# Patient Record
Sex: Female | Born: 1985
Health system: Southern US, Community
[De-identification: ages and names within clinical notes are randomized; demographics above are authoritative.]

## PROBLEM LIST (undated history)

## (undated) DIAGNOSIS — J40 Bronchitis, not specified as acute or chronic: Secondary | ICD-10-CM

## (undated) DIAGNOSIS — J45909 Unspecified asthma, uncomplicated: Secondary | ICD-10-CM

## (undated) HISTORY — PX: CHOLECYSTECTOMY: SHX55

## (undated) HISTORY — PX: DILATION AND CURETTAGE OF UTERUS: SHX78

## (undated) HISTORY — PX: CYST EXCISION: SHX5701

---
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
## 2010-08-25 DIAGNOSIS — J029 Acute pharyngitis, unspecified: Secondary | ICD-10-CM | POA: Insufficient documentation

## 2010-08-26 ENCOUNTER — Emergency Department (HOSPITAL_BASED_OUTPATIENT_CLINIC_OR_DEPARTMENT_OTHER)
Admission: EM | Admit: 2010-08-26 | Discharge: 2010-08-26 | Disposition: A | Discharge status: Home / Self Care | Payer: Medicaid Other | Attending: Emergency Medicine | Admitting: Emergency Medicine

## 2010-08-26 ENCOUNTER — Encounter: Payer: Self-pay | Admitting: *Deleted

## 2010-08-26 LAB — RAPID STREP SCREEN (MED CTR MEBANE ONLY): Streptococcus, Group A Screen (Direct): NEGATIVE

## 2010-08-26 NOTE — ED Notes (Signed)
C/o sore throat, headache, and bilateral ear pain since Thursday

## 2011-10-25 ENCOUNTER — Emergency Department (HOSPITAL_BASED_OUTPATIENT_CLINIC_OR_DEPARTMENT_OTHER)
Admission: EM | Admit: 2011-10-25 | Discharge: 2011-10-26 | Disposition: A | Discharge status: Home / Self Care | Payer: Medicaid Other | Attending: Emergency Medicine | Admitting: Emergency Medicine

## 2011-10-25 ENCOUNTER — Encounter (HOSPITAL_BASED_OUTPATIENT_CLINIC_OR_DEPARTMENT_OTHER): Payer: Self-pay | Admitting: *Deleted

## 2011-10-25 DIAGNOSIS — O98819 Other maternal infectious and parasitic diseases complicating pregnancy, unspecified trimester: Secondary | ICD-10-CM | POA: Insufficient documentation

## 2011-10-25 DIAGNOSIS — J069 Acute upper respiratory infection, unspecified: Secondary | ICD-10-CM

## 2011-10-25 LAB — RAPID STREP SCREEN (MED CTR MEBANE ONLY): Streptococcus, Group A Screen (Direct): NEGATIVE

## 2011-10-25 NOTE — ED Notes (Addendum)
Pt c/o sore throat and bil ear pain, pt seen at [place] [facility] x 1 day ago , given macrobid for uti and flonase w/o relief, pt is 17 weeks preg

## 2011-10-26 NOTE — ED Notes (Signed)
D/c to home- no new rx given 

## 2011-10-26 NOTE — ED Provider Notes (Signed)
History     CSN: [id number]  Arrival date & time 10/25/11  2319   First MD Initiated Contact with Patient 10/26/11 0007      Chief Complaint  Patient presents with  . Sore Throat    (Consider location/radiation/quality/duration/timing/severity/associated sxs/prior treatment) Patient is a 26 y.o. female presenting with pharyngitis. The history is provided by the patient.  Sore Throat This is a new problem. The current episode started 2 days ago. The problem occurs constantly. The problem has not changed since onset.Pertinent negatives include no chest pain, no abdominal pain, no headaches and no shortness of breath. Nothing aggravates the symptoms. Nothing relieves the symptoms. She has tried nothing for the symptoms. The treatment provided no relief.  Seen at [facility] and had full labs and xrays and nothing was found.  [redacted] weeks pregnant  History reviewed. No pertinent past medical history.  History reviewed. No pertinent past surgical history.  History reviewed. No pertinent family history.  History  Substance Use Topics  . Smoking status: Never Smoker   . Smokeless tobacco: Not on file  . Alcohol Use: No    OB History    Grav Para Term Preterm Abortions TAB SAB Ect Mult Living                  Review of Systems  HENT: Positive for congestion, sore throat, rhinorrhea and sneezing. Negative for trouble swallowing and voice change.   Respiratory: Negative for shortness of breath.   Cardiovascular: Negative for chest pain.  Gastrointestinal: Negative for abdominal pain.  Neurological: Negative for headaches.  All other systems reviewed and are negative.    Allergies  Review of patient's allergies indicates no known allergies.  Home Medications  No current outpatient prescriptions on file.  BP 129/77  Pulse 107  Temp 98.4 F (36.9 C) (Oral)  Resp 16  Ht 5' (1.524 m)  Wt 254 lb (115.214 kg)  BMI 49.61 kg/m2  SpO2 100%  LMP 06/30/2011  Physical Exam    Constitutional: She is oriented to person, place, and time. She appears well-developed and well-nourished.  HENT:  Head: Normocephalic and atraumatic.  Right Ear: External ear normal.  Left Ear: External ear normal.  Nose: Nose normal.  Mouth/Throat: Oropharynx is clear and moist.  Eyes: Conjunctivae normal are normal. Pupils are equal, round, and reactive to light.  Neck: Normal range of motion. Neck supple.  Cardiovascular: Normal rate and regular rhythm.   Pulmonary/Chest: Effort normal and breath sounds normal. No stridor. She has no wheezes. She has no rales.  Abdominal: Soft. Bowel sounds are normal. There is no tenderness. There is no rebound and no guarding.  Musculoskeletal: Normal range of motion.  Lymphadenopathy:    She has no cervical adenopathy.  Neurological: She is alert and oriented to person, place, and time.  Skin: Skin is warm and dry.  Psychiatric: She has a normal mood and affect.    ED Course  Procedures (including critical care time)   Labs Reviewed  RAPID STREP SCREEN   No results found.   1. URI (upper respiratory infection)       MDM  Cannot take ibuprofen nor alleve nor decongestants as is [redacted] weeks pregnant.  Call your OB for ongoing care        Ziona Wickens K Jomari Bartnik-Rasch, MD 10/26/11 0020

## 2015-03-29 ENCOUNTER — Emergency Department (HOSPITAL_BASED_OUTPATIENT_CLINIC_OR_DEPARTMENT_OTHER): Payer: Medicaid Other

## 2015-03-29 ENCOUNTER — Encounter (HOSPITAL_BASED_OUTPATIENT_CLINIC_OR_DEPARTMENT_OTHER): Payer: Self-pay | Admitting: *Deleted

## 2015-03-29 ENCOUNTER — Emergency Department (HOSPITAL_BASED_OUTPATIENT_CLINIC_OR_DEPARTMENT_OTHER)
Admission: EM | Admit: 2015-03-29 | Discharge: 2015-03-29 | Disposition: A | Discharge status: Home / Self Care | Payer: Medicaid Other | Attending: Emergency Medicine | Admitting: Emergency Medicine

## 2015-03-29 DIAGNOSIS — J069 Acute upper respiratory infection, unspecified: Secondary | ICD-10-CM

## 2015-03-29 DIAGNOSIS — J45901 Unspecified asthma with (acute) exacerbation: Secondary | ICD-10-CM | POA: Insufficient documentation

## 2015-03-29 DIAGNOSIS — R05 Cough: Secondary | ICD-10-CM | POA: Diagnosis present

## 2015-03-29 DIAGNOSIS — B9789 Other viral agents as the cause of diseases classified elsewhere: Secondary | ICD-10-CM

## 2015-03-29 HISTORY — DX: Bronchitis, not specified as acute or chronic: J40

## 2015-03-29 MED ORDER — IPRATROPIUM BROMIDE 0.02 % IN SOLN
0.5000 mg | Freq: Once | RESPIRATORY_TRACT | Status: AC
  Administered 2015-03-29: 0.5 mg via RESPIRATORY_TRACT
  Filled 2015-03-29: qty 2.5

## 2015-03-29 MED ORDER — ALBUTEROL SULFATE (2.5 MG/3ML) 0.083% IN NEBU
2.5000 mg | INHALATION_SOLUTION | Freq: Once | RESPIRATORY_TRACT | Status: AC
  Administered 2015-03-29: 2.5 mg via RESPIRATORY_TRACT
  Filled 2015-03-29: qty 3

## 2015-03-29 MED ORDER — ALBUTEROL SULFATE HFA 108 (90 BASE) MCG/ACT IN AERS: 1.0000 | INHALATION_SPRAY | Freq: Four times a day (QID) | RESPIRATORY_TRACT | Status: DC | PRN

## 2015-03-29 MED ORDER — ALBUTEROL SULFATE (2.5 MG/3ML) 0.083% IN NEBU
5.0000 mg | INHALATION_SOLUTION | Freq: Once | RESPIRATORY_TRACT | Status: AC
  Administered 2015-03-29: 5 mg via RESPIRATORY_TRACT
  Filled 2015-03-29: qty 6

## 2015-03-29 MED ORDER — IPRATROPIUM-ALBUTEROL 0.5-2.5 (3) MG/3ML IN SOLN
3.0000 mL | Freq: Once | RESPIRATORY_TRACT | Status: AC
  Administered 2015-03-29: 3 mL via RESPIRATORY_TRACT
  Filled 2015-03-29: qty 3

## 2015-03-29 MED ORDER — PREDNISONE 50 MG PO TABS
60.0000 mg | ORAL_TABLET | Freq: Once | ORAL | Status: AC
  Administered 2015-03-29: 60 mg via ORAL
  Filled 2015-03-29: qty 1

## 2015-03-29 MED ORDER — PREDNISONE 20 MG PO TABS
60.0000 mg | ORAL_TABLET | Freq: Every day | ORAL | Status: DC
Start: 2015-03-29 — End: 2015-06-15

## 2015-03-29 MED FILL — predniSONE 20 MG TABS: 20 | 4 days supply | Qty: 12 | Fill #0

## 2015-03-29 MED FILL — PROAIR HFA 90 MCG INHALER: 108 (90 BAS | 30 days supply | Qty: 9 | Fill #0

## 2015-03-29 NOTE — ED Notes (Signed)
C/o productive cough,  Dark green, wheezing, sneezing and chills onset Saturday  Denies fever,  Has used an inhaler

## 2015-03-29 NOTE — ED Provider Notes (Signed)
CSN: [id number]     Arrival date & time 03/29/15  1023 History   First MD Initiated Contact with Patient 03/29/15 1214     Chief Complaint  Patient presents with  . Cough     (Consider location/radiation/quality/duration/timing/severity/associated sxs/prior Treatment) HPI Comments: Patient with a history of Asthma presents today with a chief complaint of cough x 2 days.    Patient is a 30 y.o. female presenting with cough. The history is provided by the patient.  Cough Cough characteristics:  Non-productive Onset quality:  Gradual Duration:  2 days Timing:  Intermittent Progression:  Worsening Chronicity:  New Smoker: no   Context: not sick contacts   Relieved by:  Nothing Ineffective treatments: Theraflu, Alka Seltzer, Albuterol inhaler. Associated symptoms: rhinorrhea, shortness of breath and wheezing   Associated symptoms: no chest pain, no chills, no fever, no headaches, no sinus congestion and no sore throat     Past Medical History  Diagnosis Date  . Bronchitis    Past Surgical History  Procedure Laterality Date  . Dilation and curettage of uterus    . Cholecystectomy     No family history on file. Social History  Substance Use Topics  . Smoking status: Never Smoker   . Smokeless tobacco: Never Used  . Alcohol Use: No   OB History    No data available     Review of Systems  Constitutional: Negative for fever and chills.  HENT: Positive for rhinorrhea. Negative for sore throat.   Respiratory: Positive for cough, shortness of breath and wheezing.   Cardiovascular: Negative for chest pain.  Neurological: Negative for headaches.  All other systems reviewed and are negative.     Allergies  Review of patient's allergies indicates no known allergies.  Home Medications   Prior to Admission medications   Not on File   BP 122/74 mmHg  Pulse 103  Temp(Src) 98.3 F (36.8 C) (Oral)  Resp 18  Ht 5' (1.524 m)  Wt 109.77 kg  BMI 47.26 kg/m2  SpO2 100%   LMP 03/14/2015 Physical Exam  Constitutional: She appears well-developed and well-nourished.  HENT:  Head: Normocephalic and atraumatic.  Mouth/Throat: Oropharynx is clear and moist.  Neck: Normal range of motion. Neck supple.  Cardiovascular: Normal rate, regular rhythm and normal heart sounds.   Pulmonary/Chest: Effort normal. She has wheezes.  Mild diffuse expiratory wheezing  Musculoskeletal: Normal range of motion.  Neurological: She is alert.  Skin: Skin is warm and dry.  Psychiatric: She has a normal mood and affect.  Nursing note and vitals reviewed.   ED Course  Procedures (including critical care time) Labs Review Labs Reviewed - No data to display  Imaging Review Dg Chest 2 View  03/29/2015  CLINICAL DATA:  30 year old female with cough, wheezing and shortness breath for the past 2 days. Initial encounter. EXAM: CHEST  2 VIEW COMPARISON:  06/06/2014. FINDINGS: No infiltrate, congestive heart failure or pneumothorax. Minimal chronic peribronchial thickening. Heart size within normal limits. No plain film evidence of adenopathy. Mild anterior wedging mid thoracic vertebra with superimposed degenerative changes may have progressed slightly since prior exam. IMPRESSION: No infiltrate, congestive heart failure or pneumothorax. Minimal chronic peribronchial thickening. Mild anterior wedging mid thoracic vertebra with superimposed degenerative changes may have progressed slightly since prior exam. Electronically Signed   By: Lacy DuverneySteven  Olson M.D.   On: 03/29/2015 12:05   I have personally reviewed and evaluated these images and lab results as part of my medical decision-making.   EKG  Interpretation None     1:51 PM Reassessed patient.  She reports that breathing has improved.  Lungs CTAB.  Patient denies any SOB at this time. MDM   Final diagnoses:  None   Pt CXR negative for acute infiltrate. Patients symptoms are consistent with URI, likely viral etiology. Discussed that  antibiotics are not indicated for viral infections. Patient also with Asthma exacerbation.  Pulse ox 100 on RA.  Breath sounds improved with breathing treatment and Prednisone.  Pt will be discharged with symptomatic treatment.  Verbalizes understanding and is agreeable with plan. Pt is hemodynamically stable & in NAD prior to dc.  Stable for discharge.  Return precautions given.     Santiago Glad, PA-C 03/29/15 1624  Geoffery Lyons, MD 03/30/15 [phone]

## 2015-03-29 NOTE — ED Notes (Signed)
Cough, wheezing, SOB since Saturday morning. Assessed in triage by RT and given breathing tx with some relief

## 2015-06-15 ENCOUNTER — Encounter (HOSPITAL_BASED_OUTPATIENT_CLINIC_OR_DEPARTMENT_OTHER): Payer: Self-pay

## 2015-06-15 ENCOUNTER — Emergency Department (HOSPITAL_BASED_OUTPATIENT_CLINIC_OR_DEPARTMENT_OTHER)
Admission: EM | Admit: 2015-06-15 | Discharge: 2015-06-15 | Disposition: A | Discharge status: Home / Self Care | Payer: Medicaid Other | Attending: Emergency Medicine | Admitting: Emergency Medicine

## 2015-06-15 DIAGNOSIS — L0231 Cutaneous abscess of buttock: Secondary | ICD-10-CM | POA: Diagnosis not present

## 2015-06-15 MED ORDER — LIDOCAINE-EPINEPHRINE 1 %-1:100000 IJ SOLN
10.0000 mL | Freq: Once | INTRAMUSCULAR | Status: AC
  Administered 2015-06-15: 10 mL
  Filled 2015-06-15: qty 1

## 2015-06-15 MED ORDER — SULFAMETHOXAZOLE-TRIMETHOPRIM 800-160 MG PO TABS: 1.0000 | ORAL_TABLET | Freq: Two times a day (BID) | ORAL | Status: AC

## 2015-06-15 MED ORDER — SULFAMETHOXAZOLE-TRIMETHOPRIM 800-160 MG PO TABS
1.0000 | ORAL_TABLET | Freq: Once | ORAL | Status: AC
  Administered 2015-06-15: 1 via ORAL
  Filled 2015-06-15: qty 1

## 2015-06-15 MED ORDER — OXYCODONE-ACETAMINOPHEN 5-325 MG PO TABS
1.0000 | ORAL_TABLET | Freq: Once | ORAL | Status: AC
  Administered 2015-06-15: 1 via ORAL
  Filled 2015-06-15: qty 1

## 2015-06-15 MED ORDER — IBUPROFEN 400 MG PO TABS
400.0000 mg | ORAL_TABLET | Freq: Once | ORAL | Status: AC
  Administered 2015-06-15: 400 mg via ORAL
  Filled 2015-06-15: qty 1

## 2015-06-15 MED FILL — SULFAMETHOXAZOLE-TMP DS TAB: 800-160 | 7 days supply | Qty: 14 | Fill #0

## 2015-06-15 NOTE — ED Notes (Signed)
C/o "boil" to buttock since friday

## 2015-06-15 NOTE — Discharge Instructions (Signed)
Incision and Drainage Incision and drainage is a procedure in which a sac-like structure (cystic structure) is opened and drained. The area to be drained usually contains material such as pus, fluid, or blood.  LET YOUR CAREGIVER KNOW ABOUT:   Allergies to medicine.  Medicines taken, including vitamins, herbs, eyedrops, over-the-counter medicines, and creams.  Use of steroids (by mouth or creams).  Previous problems with anesthetics or numbing medicines.  History of bleeding problems or blood clots.  Previous surgery.  Other health problems, including diabetes and kidney problems.  Possibility of pregnancy, if this applies. RISKS AND COMPLICATIONS  Pain.  Bleeding.  Scarring.  Infection. BEFORE THE PROCEDURE  You may need to have an ultrasound or other imaging tests to see how large or deep your cystic structure is. Blood tests may also be used to determine if you have an infection or how severe the infection is. You may need to have a tetanus shot. PROCEDURE  The affected area is cleaned with a cleaning fluid. The cyst area will then be numbed with a medicine (local anesthetic). A small incision will be made in the cystic structure. A syringe or catheter may be used to drain the contents of the cystic structure, or the contents may be squeezed out. The area will then be flushed with a cleansing solution. After cleansing the area, it is often gently packed with a gauze or another wound dressing. Once it is packed, it will be covered with gauze and tape or some other type of wound dressing. AFTER THE PROCEDURE   Often, you will be allowed to go home right after the procedure.  You may be given antibiotic medicine to prevent or heal an infection.  If the area was packed with gauze or some other wound dressing, you will likely need to come back in 1 to 2 days to get it removed.  The area should heal in about 14 days.   This information is not intended to replace advice given  to you by your health care provider. Make sure you discuss any questions you have with your health care provider.   Document Released: 06/28/2000 Document Revised: 07/04/2011 Document Reviewed: 02/27/2011 Elsevier Interactive Patient Education 2016 [employer]  

## 2015-06-15 NOTE — ED Provider Notes (Signed)
CSN: [id number]     Arrival date & time 06/15/15  1147 History   First MD Initiated Contact with Patient 06/15/15 1257     Chief Complaint  Patient presents with  . Abscess     (Consider location/radiation/quality/duration/timing/severity/associated sxs/prior Treatment) HPI   30 year old female with abscess. Patient reports onset of a "boil" on Friday. Right upper buttock. Increasingly becoming more painful and larger in size. Small amount of purulent drainage. No fevers or chills. No nausea or vomiting. No history of diabetes or other immunocompromising factors that she is aware of.   Past Medical History  Diagnosis Date  . Bronchitis    Past Surgical History  Procedure Laterality Date  . Dilation and curettage of uterus    . Cholecystectomy     No family history on file. Social History  Substance Use Topics  . Smoking status: Never Smoker   . Smokeless tobacco: Never Used  . Alcohol Use: No   OB History    No data available     Review of Systems  All systems reviewed and negative, other than as noted in HPI.   Allergies  Review of patient's allergies indicates no known allergies.  Home Medications   Prior to Admission medications   Not on File   BP 127/90 mmHg  Pulse 100  Temp(Src) 98.6 F (37 C) (Oral)  Resp 20  Ht 5' (1.524 m)  Wt 243 lb (110.224 kg)  BMI 47.46 kg/m2  SpO2 100%  LMP 06/04/2015 Physical Exam  Constitutional: She appears well-developed and well-nourished. No distress.  HENT:  Head: Normocephalic and atraumatic.  Eyes: Conjunctivae are normal. Right eye exhibits no discharge. Left eye exhibits no discharge.  Neck: Neck supple.  Cardiovascular: Normal rate, regular rhythm and normal heart sounds.  Exam reveals no gallop and no friction rub.   No murmur heard. Pulmonary/Chest: Effort normal and breath sounds normal. No respiratory distress.  Abdominal: Soft. She exhibits no distension. There is no tenderness.  Musculoskeletal: She  exhibits no edema or tenderness.       Legs: Large, tender fluctuant mass in the pictured area. Approximately 3-4 cm of surrounding cellulitis. This does not extend anywhere close to anus.   Neurological: She is alert.  Skin: Skin is warm and dry.  Psychiatric: She has a normal mood and affect. Her behavior is normal. Thought content normal.  Nursing note and vitals reviewed.   ED Course  Procedures (including critical care time)  INCISION AND DRAINAGE Performed by: Raeford Razor Consent: Verbal consent obtained. Risks and benefits: risks, benefits and alternatives were discussed Type: abscess  Body area: R buttock  Anesthesia: local infiltration  Incision was made with a scalpel.  Local anesthetic: lidocaine 1% w/ epinephrine  Anesthetic total: 5 ml  Complexity: complex Blunt dissection to break up loculations  Drainage: purulent  Drainage amount: copious/foul smelling  Packing material: 1/4 in iodoform gauze  Patient tolerance: Patient tolerated the procedure well with no immediate complications.    Labs Review Labs Reviewed - No data to display  Imaging Review No results found. I have personally reviewed and evaluated these images and lab results as part of my medical decision-making.   EKG Interpretation None      MDM   Final diagnoses:  Abscess of buttock, right    29yF with alrge abscess to buttock. Initially I&D'd area to R of midline over large fluctuant mass. When draining it, I also noticed sputtering from a pin hole sized spot at midline. Second  incision made in this area with additional amount of copious drainage. There is a small area of cellulitis. Will place on abx. Return precautions discussed. Continued wound care otherwise.     Raeford RazorStephen Ryann Pauli, MD 06/16/15 [phone]

## 2018-02-22 IMAGING — CR DG CHEST 2V
2 series · 2 of 2 positions shown · non-contrast
Comparison: 06/06/2014.

CLINICAL DATA: 29-year-old female with cough, wheezing and
shortness breath for the past 2 days. Initial encounter.

EXAM:
CHEST  2 VIEW

[w chest pa]
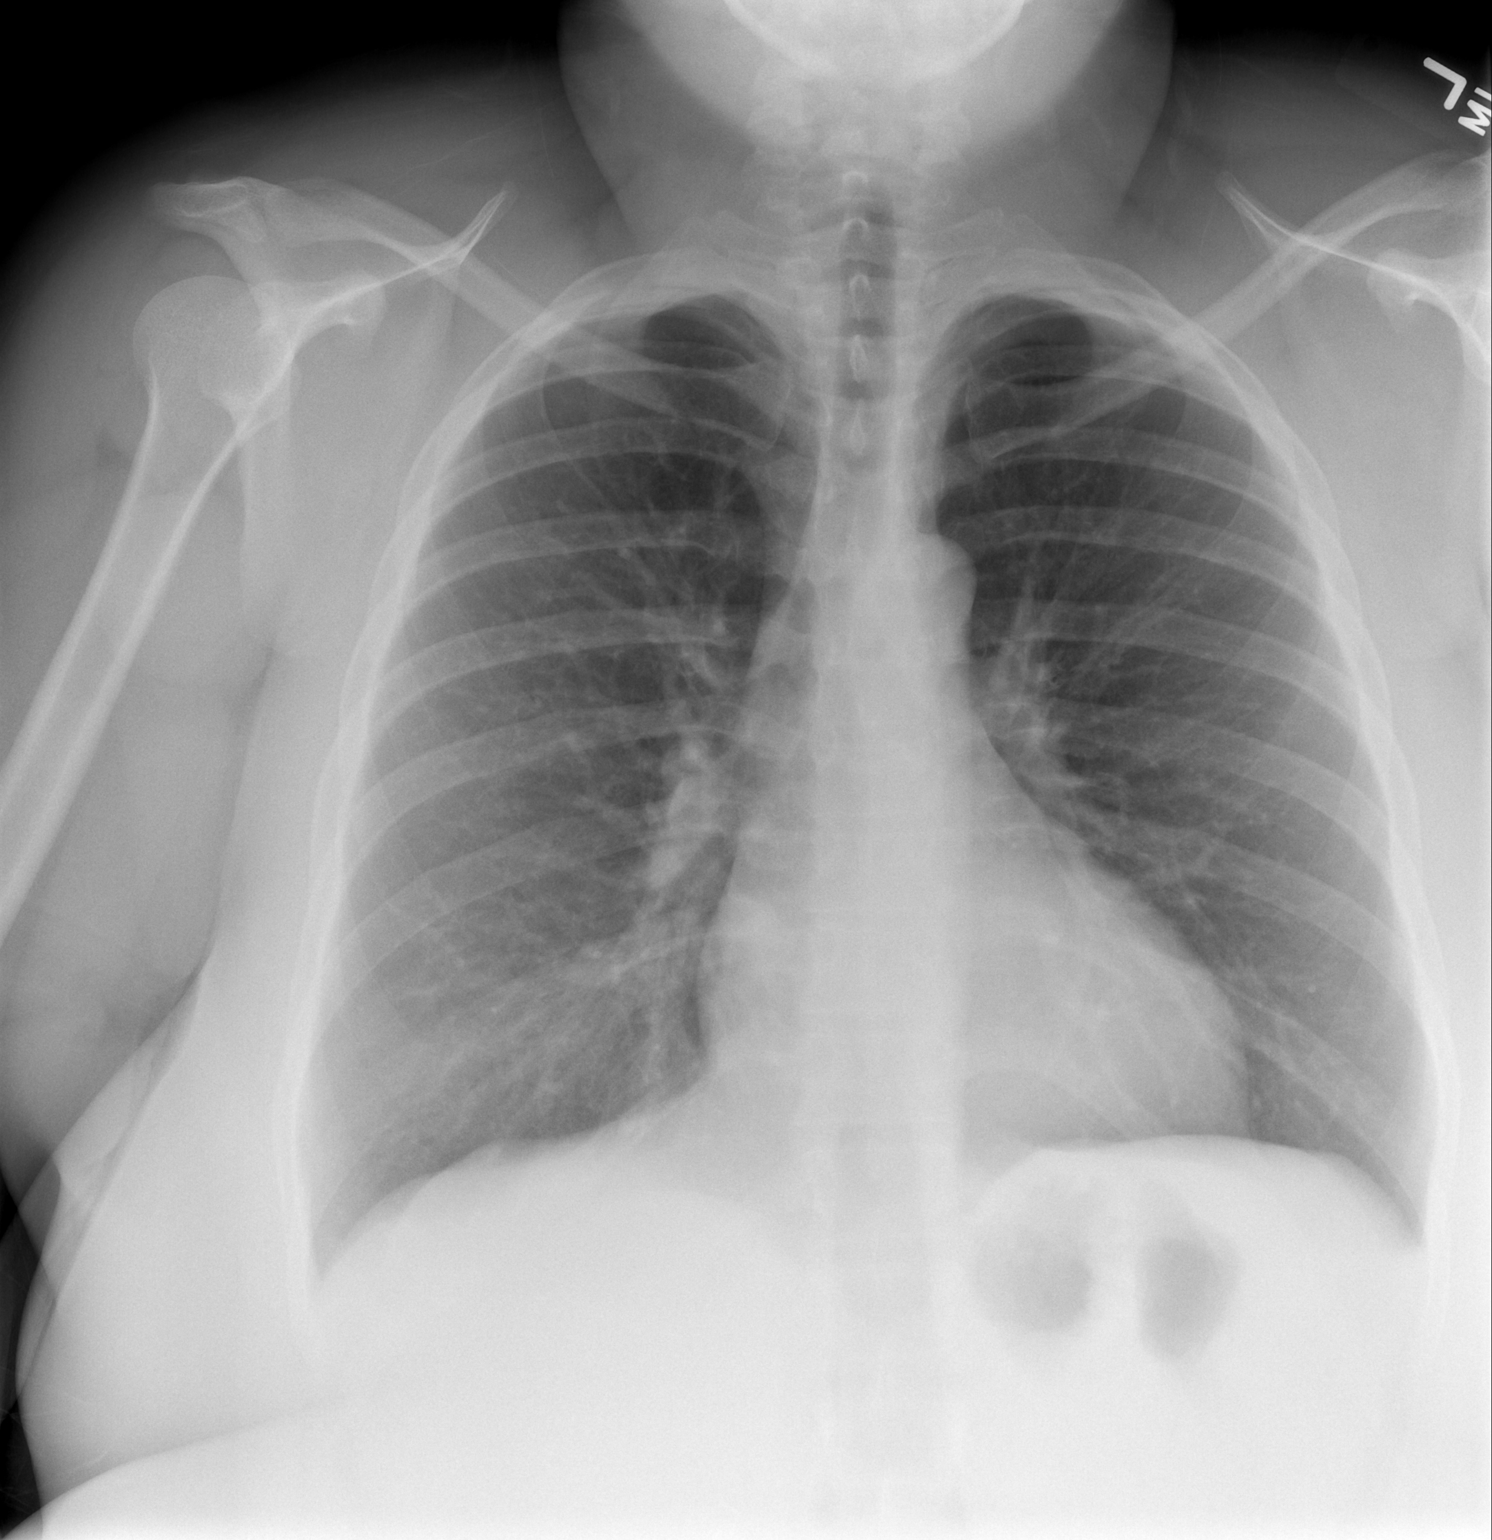

[w chest lat]
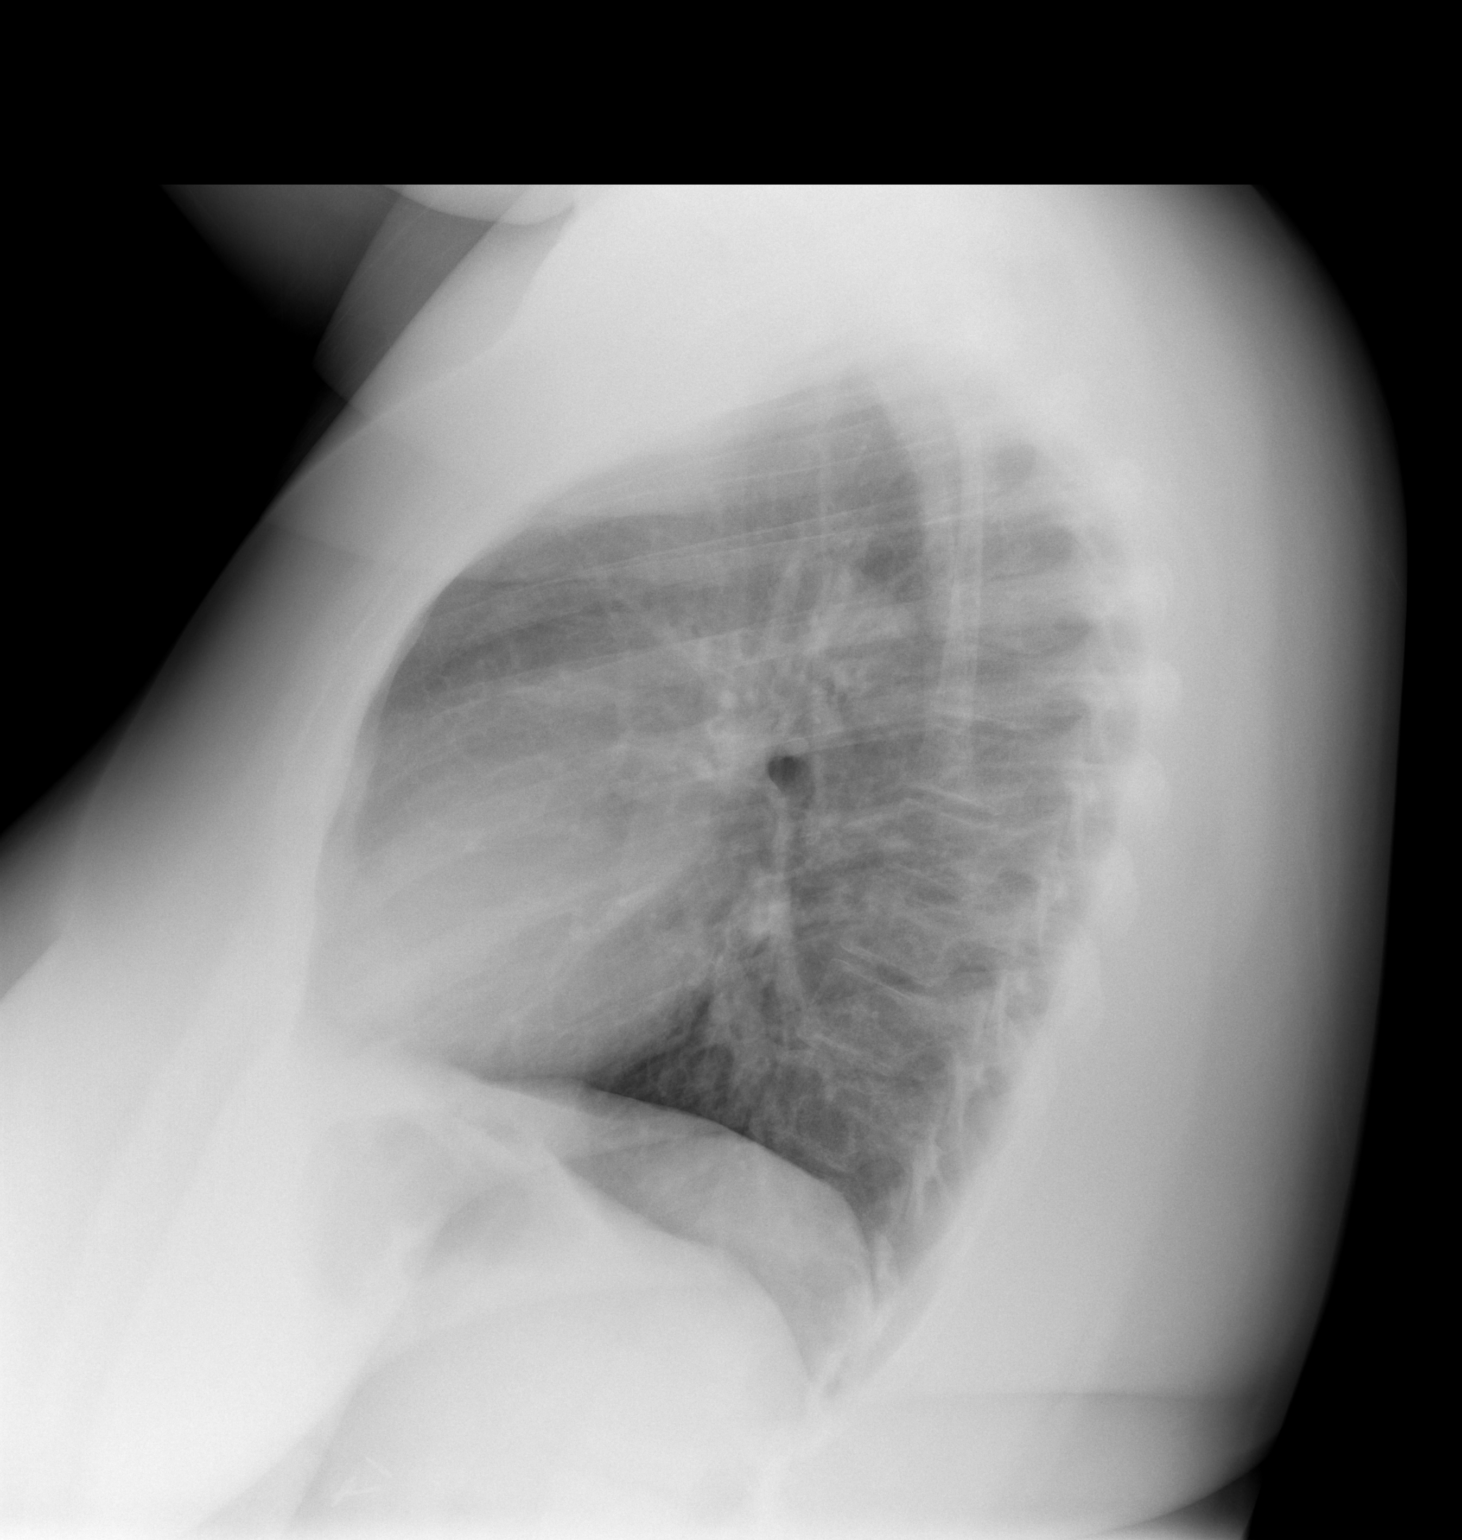

[2 of 2 positions shown; findings below may reference images not displayed]

FINDINGS: No infiltrate, congestive heart failure or pneumothorax.

Minimal chronic peribronchial thickening.

Heart size within normal limits.

No plain film evidence of adenopathy.

Mild anterior wedging mid thoracic vertebra with superimposed
degenerative changes may have progressed slightly since prior exam.
IMPRESSION: No infiltrate, congestive heart failure or pneumothorax.

Minimal chronic peribronchial thickening.

Mild anterior wedging mid thoracic vertebra with superimposed
degenerative changes may have progressed slightly since prior exam.

## 2019-08-05 ENCOUNTER — Emergency Department (HOSPITAL_BASED_OUTPATIENT_CLINIC_OR_DEPARTMENT_OTHER)
Admission: EM | Admit: 2019-08-05 | Discharge: 2019-08-05 | Disposition: A | Discharge status: Home / Self Care | Payer: Medicaid Other | Attending: Emergency Medicine | Admitting: Emergency Medicine

## 2019-08-05 ENCOUNTER — Other Ambulatory Visit: Payer: Self-pay

## 2019-08-05 ENCOUNTER — Encounter (HOSPITAL_BASED_OUTPATIENT_CLINIC_OR_DEPARTMENT_OTHER): Payer: Self-pay | Admitting: Emergency Medicine

## 2019-08-05 DIAGNOSIS — R21 Rash and other nonspecific skin eruption: Secondary | ICD-10-CM

## 2019-08-05 DIAGNOSIS — J45909 Unspecified asthma, uncomplicated: Secondary | ICD-10-CM | POA: Insufficient documentation

## 2019-08-05 DIAGNOSIS — L0291 Cutaneous abscess, unspecified: Secondary | ICD-10-CM | POA: Diagnosis present

## 2019-08-05 HISTORY — DX: Unspecified asthma, uncomplicated: J45.909

## 2019-08-05 NOTE — Discharge Instructions (Addendum)
You were evaluated in the Emergency Department and after careful evaluation, we did not find any emergent condition requiring admission or further testing in the hospital.  Your scalp condition requires continued follow-up with the dermatology specialist.  We encourage you to keep your appointment with your current dermatologist.  If this condition continues to be a problem, you could consider follow-up through the [facility] dermatology department.  You could call [phone] to schedule follow-up appointment with the [facility] dermatology team.  Please return to the Emergency Department if you experience any worsening of your condition.   Thank you for allowing Yvette Miller to be a part of your care.

## 2019-08-05 NOTE — ED Provider Notes (Signed)
[facility] Emergency Department Provider Note MRN:  [id number]  Arrival date & time: 08/05/19     Chief Complaint   Abscess   History of Present Illness   Yvette Miller is a 34 y.o. year-old female with a history of asthma presenting to the ED with chief complaint of abscess.  Patient has been struggling with recurrent boils to the scalp for over 2 years.  Seems to be getting worse, has multiple active body boils that are tender to palpation, pain when trying to lay on her back.  Has 1 boil to the right side of her head that is currently draining purulent material.  She denies fever, no other complaints.  Has follow-up with her dermatologist in 2 days.  Review of Systems  A complete 10 system review of systems was obtained and all systems are negative except as noted in the HPI and PMH.   Patient's Health History    Past Medical History:  Diagnosis Date  . Asthma   . Bronchitis     Past Surgical History:  Procedure Laterality Date  . CHOLECYSTECTOMY    . DILATION AND CURETTAGE OF UTERUS      History reviewed. No pertinent family history.  Social History   Socioeconomic History  . Marital status: Single    Spouse name: Not on file  . Number of children: Not on file  . Years of education: Not on file  . Highest education level: Not on file  Occupational History  . Not on file  Tobacco Use  . Smoking status: Never Smoker  . Smokeless tobacco: Never Used  Substance and Sexual Activity  . Alcohol use: No  . Drug use: No  . Sexual activity: Yes    Birth control/protection: None  Other Topics Concern  . Not on file  Social History Narrative  . Not on file   Social Determinants of Health   Financial Resource Strain:   . Difficulty of Paying Living Expenses:   Food Insecurity:   . Worried About Programme researcher, broadcasting/film/video in the Last Year:   . Barista in the Last Year:   Transportation Needs:   . Freight forwarder (Medical):     Marland Kitchen Lack of Transportation (Non-Medical):   Physical Activity:   . Days of Exercise per Week:   . Minutes of Exercise per Session:   Stress:   . Feeling of Stress :   Social Connections:   . Frequency of Communication with Friends and Family:   . Frequency of Social Gatherings with Friends and Family:   . Attends Religious Services:   . Active Member of Clubs or Organizations:   . Attends Banker Meetings:   Marland Kitchen Marital Status:   Intimate Partner Violence:   . Fear of Current or Ex-Partner:   . Emotionally Abused:   Marland Kitchen Physically Abused:   . Sexually Abused:      Physical Exam   Vitals:   08/05/19 0744  BP: (!) 120/91  Pulse: 96  Resp: 16  Temp: 99 F (37.2 C)  SpO2: 100%    CONSTITUTIONAL: Well-appearing, NAD NEURO:  Alert and oriented x 3, no focal deficits EYES:  eyes equal and reactive ENT/NECK:  no LAD, no JVD CARDIO: Regular rate, well-perfused, normal S1 and S2 PULM:  CTAB no wheezing or rhonchi GI/GU:  normal bowel sounds, non-distended, non-tender MSK/SPINE:  No gross deformities, no edema SKIN: Multiple circular areas of alopecia to the scalp with  underlying boggy carbuncles, 1 area to the right temporal region actively draining purulent material PSYCH:  Appropriate speech and behavior  *Additional and/or pertinent findings included in MDM below  Diagnostic and Interventional Summary    EKG Interpretation  Date/Time:    Ventricular Rate:    PR Interval:    QRS Duration:   QT Interval:    QTC Calculation:   R Axis:     Text Interpretation:        Labs Reviewed - No data to display  No orders to display    Medications - No data to display   Procedures  /  Critical Care Procedures  ED Course and Medical Decision Making  I have reviewed the triage vital signs, the nursing notes, and pertinent available records from the EMR.  Listed above are laboratory and imaging tests that I personally ordered, reviewed, and interpreted and then  considered in my medical decision making (see below for details).      Suspect noninfectious scalp condition given the chronicity.  Possibly erosive pustular dermatosis of the scalp.  Patient appropriate for discharge with continued dermatology follow-up.  No fever, no systemic symptoms, no indication for I&D today.    Elmer Sow. Pilar Plate, MD [facility] Emergency Medicine [facility] [email or web address]  Final Clinical Impressions(s) / ED Diagnoses     ICD-10-CM   1. Eruption of scalp  R21     ED Discharge Orders    None       Discharge Instructions Discussed with and Provided to Patient:     Discharge Instructions     You were evaluated in the Emergency Department and after careful evaluation, we did not find any emergent condition requiring admission or further testing in the hospital.  Your scalp condition requires continued follow-up with the dermatology specialist.  We encourage you to keep your appointment with your current dermatologist.  If this condition continues to be a problem, you could consider follow-up through the [facility] dermatology department.  You could call [phone] to schedule follow-up appointment with the [facility] dermatology team.  Please return to the Emergency Department if you experience any worsening of your condition.   Thank you for allowing [place] to be a part of your care.      Sabas Sous, MD 08/05/19 [phone]

## 2019-08-05 NOTE — ED Triage Notes (Signed)
Pt c/o recurrent abscesses on posterior scalp. Pt reports ongoing for 6 months, treated with abx multiple times with little improvement.

## 2020-01-08 ENCOUNTER — Other Ambulatory Visit: Payer: Self-pay

## 2020-01-08 ENCOUNTER — Encounter (HOSPITAL_COMMUNITY): Payer: Self-pay | Admitting: Emergency Medicine

## 2020-01-08 ENCOUNTER — Emergency Department (HOSPITAL_COMMUNITY)
Admission: EM | Admit: 2020-01-08 | Discharge: 2020-01-08 | Disposition: A | Discharge status: Home / Self Care | Payer: Medicaid Other | Attending: Emergency Medicine | Admitting: Emergency Medicine

## 2020-01-08 DIAGNOSIS — R059 Cough, unspecified: Secondary | ICD-10-CM | POA: Diagnosis present

## 2020-01-08 DIAGNOSIS — J45909 Unspecified asthma, uncomplicated: Secondary | ICD-10-CM | POA: Diagnosis not present

## 2020-01-08 DIAGNOSIS — U071 COVID-19: Secondary | ICD-10-CM | POA: Diagnosis not present

## 2020-01-08 MED ORDER — SODIUM CHLORIDE 0.9 % IV SOLN: 1200.0000 mg | Freq: Once | INTRAVENOUS | Status: DC

## 2020-01-08 MED ORDER — METHYLPREDNISOLONE SODIUM SUCC 125 MG IJ SOLR: 125.0000 mg | Freq: Once | INTRAMUSCULAR | Status: DC | PRN

## 2020-01-08 MED ORDER — ALBUTEROL SULFATE HFA 108 (90 BASE) MCG/ACT IN AERS: 2.0000 | INHALATION_SPRAY | Freq: Once | RESPIRATORY_TRACT | Status: DC | PRN

## 2020-01-08 MED ORDER — EPINEPHRINE 0.3 MG/0.3ML IJ SOAJ: 0.3000 mg | Freq: Once | INTRAMUSCULAR | Status: DC | PRN

## 2020-01-08 MED ORDER — DIPHENHYDRAMINE HCL 50 MG/ML IJ SOLN: 50.0000 mg | Freq: Once | INTRAMUSCULAR | Status: DC | PRN

## 2020-01-08 MED ORDER — SODIUM CHLORIDE 0.9 % IV SOLN
Freq: Once | INTRAVENOUS | Status: AC
  Filled 2020-01-08: qty 20

## 2020-01-08 MED ORDER — ONDANSETRON HCL 4 MG/2ML IJ SOLN
4.0000 mg | Freq: Once | INTRAMUSCULAR | Status: AC
  Administered 2020-01-08: 4 mg via INTRAVENOUS
  Filled 2020-01-08: qty 2

## 2020-01-08 MED ORDER — FAMOTIDINE IN NACL 20-0.9 MG/50ML-% IV SOLN: 20.0000 mg | Freq: Once | INTRAVENOUS | Status: DC | PRN

## 2020-01-08 MED ORDER — SODIUM CHLORIDE 0.9 % IV SOLN: INTRAVENOUS | Status: DC | PRN

## 2020-01-08 NOTE — ED Notes (Signed)
Patient hooked up to purewick.

## 2020-01-08 NOTE — ED Provider Notes (Signed)
[facility]-EMERGENCY DEPT Provider Note   CSN: [id number] Arrival date & time: 01/08/20  1327     History Chief Complaint  Patient presents with   Covid Positive    Yvette Miller is a 34 y.o. female.  Patient presents with chief complaint of cough generalized malaise congestion body aches.  She is Covid positive, on day 7 of symptoms.        Past Medical History:  Diagnosis Date   Asthma    Bronchitis     There are no problems to display for this patient.   Past Surgical History:  Procedure Laterality Date   CHOLECYSTECTOMY     DILATION AND CURETTAGE OF UTERUS       OB History   No obstetric history on file.     No family history on file.  Social History   Tobacco Use   Smoking status: Never Smoker   Smokeless tobacco: Never Used  Substance Use Topics   Alcohol use: No   Drug use: No    Home Medications Prior to Admission medications   Not on File    Allergies    Codeine and Hydrocodone-acetaminophen  Review of Systems   Review of Systems  Constitutional: Negative for fever.  HENT: Negative for ear pain.   Eyes: Negative for pain.  Respiratory: Positive for cough.   Cardiovascular: Negative for chest pain.  Gastrointestinal: Negative for abdominal pain.  Genitourinary: Negative for flank pain.  Musculoskeletal: Negative for back pain.  Skin: Negative for rash.  Neurological: Positive for headaches.    Physical Exam Updated Vital Signs BP (!) 127/91 (BP Location: Right Arm)    Pulse 81    Temp 98.3 F (36.8 C) (Oral)    Resp 20    LMP 01/01/2020    SpO2 98%   Physical Exam Constitutional:      General: She is not in acute distress.    Appearance: Normal appearance.     Comments: BMI greater than 35.  HENT:     Head: Normocephalic.     Nose: Nose normal.  Eyes:     Extraocular Movements: Extraocular movements intact.  Cardiovascular:     Rate and Rhythm: Normal rate.  Pulmonary:     Effort:  Pulmonary effort is normal.  Abdominal:     Palpations: Abdomen is soft.     Tenderness: There is no abdominal tenderness.  Musculoskeletal:        General: Normal range of motion.     Cervical back: Normal range of motion.  Skin:    General: Skin is warm.  Neurological:     General: No focal deficit present.     Mental Status: She is alert.     ED Results / Procedures / Treatments   Labs (all labs ordered are listed, but only abnormal results are displayed) Labs Reviewed - No data to display  EKG None  Radiology No results found.  Procedures Procedures (including critical care time)  Medications Ordered in ED Medications  0.9 %  sodium chloride infusion (has no administration in time range)  diphenhydrAMINE (BENADRYL) injection 50 mg (has no administration in time range)  famotidine (PEPCID) IVPB 20 mg premix (has no administration in time range)  methylPREDNISolone sodium succinate (SOLU-MEDROL) 125 mg/2 mL injection 125 mg (has no administration in time range)  albuterol (VENTOLIN HFA) 108 (90 Base) MCG/ACT inhaler 2 puff (has no administration in time range)  EPINEPHrine (EPI-PEN) injection 0.3 mg (has no administration in time  range)  bamlanivimab 700 mg, etesevimab 1,400 mg in sodium chloride 0.9 % 160 mL IVPB (has no administration in time range)  ondansetron (ZOFRAN) injection 4 mg (has no administration in time range)    ED Course  I have reviewed the triage vital signs and the nursing notes.  Pertinent labs & imaging results that were available during my care of the patient were reviewed by me and considered in my medical decision making (see chart for details).    MDM Rules/Calculators/A&P                          Patient is prediabetic with a BMI greater than 35 not coming on with Covid symptoms on day 7.  I feel she will benefit from medical antibiotic therapy.  Provider here in the ER.  Will be discharged home after medical antibody infusion.   Recommending isolation at home continue rest and use of a finger pulse oximeter.  Advised return if the oxygen level drops below 90% or she has any additional concerns.   Final Clinical Impression(s) / ED Diagnoses Final diagnoses:  COVID-19 virus infection    Rx / DC Orders ED Discharge Orders    None       Cheryll Cockayne, MD 01/08/20 1443

## 2020-01-08 NOTE — ED Triage Notes (Signed)
Per EMS pt from home where Covid+ and hurts to breath and coughing phlegm, and continuous v/d and having hard time keeping anything down.  BP 138/84, 80HR, 24R, CBG 134, temp 97.3, 99%.

## 2020-01-14 ENCOUNTER — Telehealth: Payer: Self-pay | Admitting: Nurse Practitioner

## 2020-01-14 NOTE — Telephone Encounter (Signed)
Post [facility] [phone] called pt [phone] LVM for ED f/u on pt recovery & transition of care to establish with PCP.

## 2020-02-16 ENCOUNTER — Encounter (HOSPITAL_BASED_OUTPATIENT_CLINIC_OR_DEPARTMENT_OTHER): Payer: Self-pay | Admitting: *Deleted

## 2020-02-16 ENCOUNTER — Other Ambulatory Visit: Payer: Self-pay

## 2020-02-16 ENCOUNTER — Emergency Department (HOSPITAL_BASED_OUTPATIENT_CLINIC_OR_DEPARTMENT_OTHER)
Admission: EM | Admit: 2020-02-16 | Discharge: 2020-02-16 | Disposition: A | Discharge status: Home / Self Care | Payer: Medicaid Other | Attending: Emergency Medicine | Admitting: Emergency Medicine

## 2020-02-16 DIAGNOSIS — N938 Other specified abnormal uterine and vaginal bleeding: Secondary | ICD-10-CM | POA: Insufficient documentation

## 2020-02-16 DIAGNOSIS — B9689 Other specified bacterial agents as the cause of diseases classified elsewhere: Secondary | ICD-10-CM

## 2020-02-16 DIAGNOSIS — N760 Acute vaginitis: Secondary | ICD-10-CM

## 2020-02-16 DIAGNOSIS — N898 Other specified noninflammatory disorders of vagina: Secondary | ICD-10-CM

## 2020-02-16 DIAGNOSIS — J45909 Unspecified asthma, uncomplicated: Secondary | ICD-10-CM | POA: Insufficient documentation

## 2020-02-16 LAB — URINALYSIS, ROUTINE W REFLEX MICROSCOPIC
Bilirubin Urine: NEGATIVE
Glucose, UA: NEGATIVE mg/dL
Hgb urine dipstick: NEGATIVE
Ketones, ur: NEGATIVE mg/dL
Nitrite: NEGATIVE
Protein, ur: NEGATIVE mg/dL
Specific Gravity, Urine: 1.02 (ref 1.005–1.030)
pH: 6.5 (ref 5.0–8.0)

## 2020-02-16 LAB — CBC WITH DIFFERENTIAL/PLATELET
Abs Immature Granulocytes: 0.02 10*3/uL (ref 0.00–0.07)
Basophils Absolute: 0 10*3/uL (ref 0.0–0.1)
Basophils Relative: 0 %
Eosinophils Absolute: 0.1 10*3/uL (ref 0.0–0.5)
Eosinophils Relative: 1 %
HCT: 38.2 % (ref 36.0–46.0)
Hemoglobin: 13.2 g/dL (ref 12.0–15.0)
Immature Granulocytes: 0 %
Lymphocytes Relative: 26 %
Lymphs Abs: 1.9 10*3/uL (ref 0.7–4.0)
MCH: 29.9 pg (ref 26.0–34.0)
MCHC: 34.6 g/dL (ref 30.0–36.0)
MCV: 86.6 fL (ref 80.0–100.0)
Monocytes Absolute: 0.4 10*3/uL (ref 0.1–1.0)
Monocytes Relative: 6 %
Neutro Abs: 5 10*3/uL (ref 1.7–7.7)
Neutrophils Relative %: 67 %
Platelets: 320 10*3/uL (ref 150–400)
RBC: 4.41 MIL/uL (ref 3.87–5.11)
RDW: 14.1 % (ref 11.5–15.5)
WBC: 7.5 10*3/uL (ref 4.0–10.5)
nRBC: 0 % (ref 0.0–0.2)

## 2020-02-16 LAB — WET PREP, GENITAL
Sperm: NONE SEEN
Trich, Wet Prep: NONE SEEN
Yeast Wet Prep HPF POC: NONE SEEN

## 2020-02-16 LAB — URINALYSIS, MICROSCOPIC (REFLEX)

## 2020-02-16 LAB — COMPREHENSIVE METABOLIC PANEL
ALT: 14 U/L (ref 0–44)
AST: 13 U/L — ABNORMAL LOW (ref 15–41)
Albumin: 3.5 g/dL (ref 3.5–5.0)
Alkaline Phosphatase: 88 U/L (ref 38–126)
Anion gap: 10 (ref 5–15)
BUN: 9 mg/dL (ref 6–20)
CO2: 23 mmol/L (ref 22–32)
Calcium: 8.2 mg/dL — ABNORMAL LOW (ref 8.9–10.3)
Chloride: 102 mmol/L (ref 98–111)
Creatinine, Ser: 0.71 mg/dL (ref 0.44–1.00)
GFR, Estimated: >60 mL/min (ref >60)
Glucose, Bld: 89 mg/dL (ref 70–99)
Potassium: 3.2 mmol/L — ABNORMAL LOW (ref 3.5–5.1)
Sodium: 135 mmol/L (ref 135–145)
Total Bilirubin: 0.5 mg/dL (ref 0.3–1.2)
Total Protein: 7.2 g/dL (ref 6.5–8.1)

## 2020-02-16 LAB — PREGNANCY, URINE: Preg Test, Ur: NEGATIVE

## 2020-02-16 LAB — HCG, QUANTITATIVE, PREGNANCY: hCG, Beta Chain, Quant, S: <1 m[IU]/mL (ref <5)

## 2020-02-16 LAB — LIPASE, BLOOD: Lipase: 21 U/L (ref 11–51)

## 2020-02-16 LAB — HIV ANTIBODY (ROUTINE TESTING W REFLEX): HIV Screen 4th Generation wRfx: NONREACTIVE

## 2020-02-16 MED ORDER — POTASSIUM CHLORIDE CRYS ER 20 MEQ PO TBCR
40.0000 meq | EXTENDED_RELEASE_TABLET | Freq: Once | ORAL | Status: AC
  Administered 2020-02-16: 40 meq via ORAL
  Filled 2020-02-16: qty 2

## 2020-02-16 MED ORDER — METRONIDAZOLE 0.75 % VA GEL: 1.0000 | Freq: Every day | VAGINAL | 0 refills | Status: AC

## 2020-02-16 MED ORDER — ACETAMINOPHEN 325 MG PO TABS
650.0000 mg | ORAL_TABLET | Freq: Once | ORAL | Status: AC
  Administered 2020-02-16: 650 mg via ORAL
  Filled 2020-02-16: qty 2

## 2020-02-16 NOTE — ED Notes (Signed)
Pelvic Cart at bedside, pt instructed to remove pants and undergarments

## 2020-02-16 NOTE — ED Triage Notes (Signed)
C/o vaginal discharge and pelvic pain x 3 days

## 2020-02-16 NOTE — ED Provider Notes (Signed)
Care assumed from S. Patel PA-C at shift change pending labs and reassessment.  See her note for full H&P and physical exam.  Briefly this is a 35 year old female presenting with vaginal discharge, abdominal pain, and dysuria x10 days.  Per previous provider she had very mild generalized tenderness throughout lower abdomen without guarding or peritoneal signs.  Pelvic exam was also performed and showed mild clear discharge noted to be coming from the cervix.  No cervical motion or adnexal tenderness.  UA shows small leukocytes and few bacteria.  Urine culture was sent.  Pregnancy test is negative.   Physical Exam  BP 127/85 (BP Location: Right Arm)   Pulse 80   Temp 98.2 F (36.8 C) (Oral)   Resp 18   Ht 4\' 11"  (1.499 m)   Wt 134.7 kg   LMP 01/02/2020   SpO2 100%   BMI 59.99 kg/m   Physical Exam PE: Constitutional: well-developed, well-nourished, no apparent distress HENT: normocephalic, atraumatic. no cervical adenopathy Cardiovascular: normal rate and rhythm, distal pulses intact Pulmonary/Chest: effort normal; breath sounds clear and equal bilaterally; no wheezes or rales Abdominal: soft and nontender Musculoskeletal: full ROM, no edema Neurological: alert with goal directed thinking Skin: warm and dry, no rash, no diaphoresis Psychiatric: normal mood and affect, normal behavior   ED Course/Procedures      MDM  Please see previous provider note to include MDM up to this point.  Wet prep is positive for clue cells and WBC.  Will treat for BV with MetroGel at patient's request. HIV nonreactive.  CBC unremarkable. CMP with mild hypokalemia 3.2, otherwise unremarkable.  Patient given p.o. potassium. Lipase within normal range.  Serial abdominal exams are benign.  Low suspicion for acute surgical abdomen. She declines prophylactic STD treatment.  Engaged in shared decision making with patient regarding treatment for UTI.  UA does not have obvious urinary tract infection and a  urine culture was sent.  Patient agrees to hold off on treatment at this time and wait for culture to result.  The patient appears reasonably screened and/or stabilized for discharge and I doubt any other medical condition or other [facility] requiring further screening, evaluation, or treatment in the ED at this time prior to discharge. The patient is safe for discharge with strict return precautions discussed. Recommend pcp follow up if symptoms persist.    Portions of this note were generated with Dragon dictation software. Dictation errors may occur despite best attempts at proofreading.      [facility], PA-C 02/16/20 2202    2203, DO 02/16/20 2316

## 2020-02-16 NOTE — Discharge Instructions (Signed)
-  Prescription sent to the pharmacy for MetroGel.  This is used to treat bacterial vaginosis.  Use as prescribed.  -If your urine culture grows out bacteria to show that you have a urinary tract infection a pharmacist will call you in an antibiotic will be prescribed.  -If gonorrhea and Chlamydia tests are positive you will receive a phone call.  You will need to be treated by primary care doctor, the health department or in the emergency room.  Your partner(s) will also need to be treated.  -Return to the emergency room if any new or worsening symptoms.

## 2020-02-16 NOTE — ED Provider Notes (Signed)
[facility] EMERGENCY DEPARTMENT Provider Note   CSN: [id number] Arrival date & time: 02/16/20  1505     History Chief Complaint  Patient presents with  . Vaginal Discharge    Yvette Miller is a 35 y.o. female with no pertinent past medical history that presents to the emergency department today for vaginal discharge, abdominal pain.  Patient states that she is late by 10 days on her period, states that she has never been late in her life unless she is pregnant.  States that she feels as if she is pregnant.  States that she had her last period ended December 21, is not on any birth control.  Denies any vaginal bleeding or spotting.  Does admit to some white discharge, states that she only gets this when she was pregnant.  Denies any vaginal itching or odor.  Patient states that she has had 3 pregnancies, her last pregnancy was 7 years ago.  States that abdominal pain feels as if it is a cramping sensation.  States that she also gets this when she is pregnant.  Has not been taking anything for this.  Patient also states that she has been urinating frequently, denies any dysuria or hematuria.  Denies any chance of STDs, is in a monogamous relationship with her husband.  Does not use protection.  States that abdominal pain started a week ago, in her suprapubic region as well as her lower quadrants.  Denies any fevers or chills.  Denies any nausea or vomiting.  Patient states that she is generally healthy.  No other complaints, patient states that she is primarily here because she missed her period for 10 days, took 2 pregnancy tests at home which are negative, she states that she is very concerned that she is pregnant.  HPI     Past Medical History:  Diagnosis Date  . Asthma   . Bronchitis     There are no problems to display for this patient.   Past Surgical History:  Procedure Laterality Date  . CHOLECYSTECTOMY    . DILATION AND CURETTAGE OF UTERUS       OB History   No  obstetric history on file.     No family history on file.  Social History   Tobacco Use  . Smoking status: Never Smoker  . Smokeless tobacco: Never Used  Substance Use Topics  . Alcohol use: No  . Drug use: No    Home Medications Prior to Admission medications   Not on File    Allergies    Codeine and Hydrocodone-acetaminophen  Review of Systems   Review of Systems  Constitutional: Negative for chills, diaphoresis, fatigue and fever.  HENT: Negative for congestion, sore throat and trouble swallowing.   Eyes: Negative for pain and visual disturbance.  Respiratory: Negative for cough, shortness of breath and wheezing.   Cardiovascular: Negative for chest pain, palpitations and leg swelling.  Gastrointestinal: Positive for abdominal pain. Negative for abdominal distention, diarrhea, nausea and vomiting.  Genitourinary: Positive for frequency, pelvic pain and vaginal discharge. Negative for difficulty urinating, dyspareunia, dysuria, enuresis, flank pain, genital sores, hematuria, menstrual problem, vaginal bleeding and vaginal pain.  Musculoskeletal: Negative for back pain, neck pain and neck stiffness.  Skin: Negative for pallor.  Neurological: Negative for dizziness, speech difficulty, weakness and headaches.  Psychiatric/Behavioral: Negative for confusion.    Physical Exam Updated Vital Signs BP 127/85 (BP Location: Right Arm)   Pulse 80   Temp 98.2 F (36.8 C) (Oral)  Resp 18   Ht 4\' 11"  (1.499 m)   Wt 134.7 kg   LMP 01/02/2020   SpO2 100%   BMI 59.99 kg/m   Physical Exam Constitutional:      General: She is not in acute distress.    Appearance: Normal appearance. She is not ill-appearing, toxic-appearing or diaphoretic.  HENT:     Mouth/Throat:     Mouth: Mucous membranes are moist.     Pharynx: Oropharynx is clear.  Eyes:     General: No scleral icterus.    Extraocular Movements: Extraocular movements intact.     Pupils: Pupils are equal, round, and  reactive to light.  Cardiovascular:     Rate and Rhythm: Normal rate and regular rhythm.     Pulses: Normal pulses.     Heart sounds: Normal heart sounds.  Pulmonary:     Effort: Pulmonary effort is normal. No respiratory distress.     Breath sounds: Normal breath sounds. No stridor. No wheezing, rhonchi or rales.  Chest:     Chest wall: No tenderness.  Abdominal:     General: Abdomen is flat. There is no distension.     Palpations: Abdomen is soft.     Tenderness: There is no abdominal tenderness. There is no guarding or rebound.       Comments: Patient was very mild generalized tenderness throughout lower abdomen is depicted above.  No guarding.  Abdomen is soft.  Genitourinary:    Comments: Chaperone present.  Vaginal exam with mild clear discharge noted coming from cervix.  Cervix is closed, does not appear friable.  No bleeding.  No odorous smell present.  Bimanual exam without any cervical motion or adnexal tenderness. Musculoskeletal:        General: No swelling or tenderness. Normal range of motion.     Cervical back: Normal range of motion and neck supple. No rigidity.     Right lower leg: No edema.     Left lower leg: No edema.  Skin:    General: Skin is warm and dry.     Capillary Refill: Capillary refill takes less than 2 seconds.     Coloration: Skin is not pale.  Neurological:     General: No focal deficit present.     Mental Status: She is alert and oriented to person, place, and time.  Psychiatric:        Mood and Affect: Mood normal.        Behavior: Behavior normal.     ED Results / Procedures / Treatments   Labs (all labs ordered are listed, but only abnormal results are displayed) Labs Reviewed  URINALYSIS, ROUTINE W REFLEX MICROSCOPIC - Abnormal; Notable for the following components:      Result Value   Leukocytes,Ua SMALL (*)    All other components within normal limits  URINALYSIS, MICROSCOPIC (REFLEX) - Abnormal; Notable for the following  components:   Bacteria, UA FEW (*)    All other components within normal limits  WET PREP, GENITAL  URINE CULTURE  PREGNANCY, URINE  HIV ANTIBODY (ROUTINE TESTING W REFLEX)  HCG, QUANTITATIVE, PREGNANCY  RPR  CBC WITH DIFFERENTIAL/PLATELET  COMPREHENSIVE METABOLIC PANEL  LIPASE, BLOOD  GC/CHLAMYDIA PROBE AMP ([place]) NOT AT [facility]    EKG None  Radiology No results found.  Procedures Procedures   Medications Ordered in ED Medications - No data to display  ED Course  I have reviewed the triage vital signs and the nursing notes.  Pertinent labs &  imaging results that were available during my care of the patient were reviewed by me and considered in my medical decision making (see chart for details).    MDM Rules/Calculators/A&P                           Yvette Miller is a 35 y.o. female with no pertinent past medical history that presents to the emergency department today for vaginal discharge, abdominal pain.  Patient concern for pregnancy.  Patient appears very well, very benign physical exam.  No guarding on abdominal exam, will obtain basic labs at this time.  Pelvic exam without any adnexal motion tenderness or cervical motion tenderness.  Urinalysis does show some small leukocytes with few bacteria.  Low suspicion for UTI, however patient is complaining of urinary frequency therefore I will treat this.  Quantitative hCG less than 1.  Pt care was handed off to K. Walisiewicz PA-C at hand off.  Complete history and physical and current plan have been communicated.  Please refer to their note for the remainder of ED care and ultimate disposition.  Awaiting basic blood work, wet prep.   Final Clinical Impression(s) / ED Diagnoses Final diagnoses:  Vaginal discharge    Rx / DC Orders ED Discharge Orders    None       Farrel Gordon, PA-C 02/16/20 2003    Rozelle Logan, DO 02/16/20 2317

## 2020-02-17 LAB — GC/CHLAMYDIA PROBE AMP (~~LOC~~) NOT AT ARMC
Chlamydia: NEGATIVE
Comment: NEGATIVE
Comment: NORMAL
Neisseria Gonorrhea: NEGATIVE

## 2020-02-17 LAB — RPR: RPR Ser Ql: NONREACTIVE

## 2020-02-18 LAB — URINE CULTURE: Culture: <10000 — AB

## 2022-06-08 ENCOUNTER — Emergency Department (HOSPITAL_BASED_OUTPATIENT_CLINIC_OR_DEPARTMENT_OTHER): Payer: Medicaid Other

## 2022-06-08 ENCOUNTER — Encounter (HOSPITAL_BASED_OUTPATIENT_CLINIC_OR_DEPARTMENT_OTHER): Payer: Self-pay

## 2022-06-08 ENCOUNTER — Other Ambulatory Visit: Payer: Self-pay | Admitting: Obstetrics and Gynecology

## 2022-06-08 ENCOUNTER — Emergency Department (HOSPITAL_BASED_OUTPATIENT_CLINIC_OR_DEPARTMENT_OTHER)
Admission: EM | Admit: 2022-06-08 | Discharge: 2022-06-08 | Disposition: A | Discharge status: Home / Self Care | Payer: Medicaid Other | Attending: Emergency Medicine | Admitting: Emergency Medicine

## 2022-06-08 DIAGNOSIS — J45909 Unspecified asthma, uncomplicated: Secondary | ICD-10-CM | POA: Diagnosis not present

## 2022-06-08 DIAGNOSIS — R8271 Bacteriuria: Secondary | ICD-10-CM | POA: Diagnosis not present

## 2022-06-08 DIAGNOSIS — O23599 Infection of other part of genital tract in pregnancy, unspecified trimester: Secondary | ICD-10-CM | POA: Diagnosis not present

## 2022-06-08 DIAGNOSIS — Z3A00 Weeks of gestation of pregnancy not specified: Secondary | ICD-10-CM | POA: Diagnosis not present

## 2022-06-08 DIAGNOSIS — O209 Hemorrhage in early pregnancy, unspecified: Secondary | ICD-10-CM | POA: Diagnosis not present

## 2022-06-08 DIAGNOSIS — O26899 Other specified pregnancy related conditions, unspecified trimester: Secondary | ICD-10-CM | POA: Diagnosis present

## 2022-06-08 DIAGNOSIS — O3680X0 Pregnancy with inconclusive fetal viability, not applicable or unspecified: Secondary | ICD-10-CM

## 2022-06-08 DIAGNOSIS — B9689 Other specified bacterial agents as the cause of diseases classified elsewhere: Secondary | ICD-10-CM

## 2022-06-08 LAB — URINALYSIS, ROUTINE W REFLEX MICROSCOPIC
Bilirubin Urine: NEGATIVE
Glucose, UA: NEGATIVE mg/dL
Ketones, ur: NEGATIVE mg/dL
Nitrite: NEGATIVE
Protein, ur: 100 mg/dL — AB
Specific Gravity, Urine: 1.02 (ref 1.005–1.030)
pH: 7 (ref 5.0–8.0)

## 2022-06-08 LAB — WET PREP, GENITAL
Sperm: NONE SEEN
Trich, Wet Prep: NONE SEEN
WBC, Wet Prep HPF POC: >=10 — AB (ref <10)
Yeast Wet Prep HPF POC: NONE SEEN

## 2022-06-08 LAB — CBC WITH DIFFERENTIAL/PLATELET
Abs Immature Granulocytes: 0.02 10*3/uL (ref 0.00–0.07)
Basophils Absolute: 0 10*3/uL (ref 0.0–0.1)
Basophils Relative: 1 %
Eosinophils Absolute: 0.1 10*3/uL (ref 0.0–0.5)
Eosinophils Relative: 1 %
HCT: 34.3 % — ABNORMAL LOW (ref 36.0–46.0)
Hemoglobin: 11.2 g/dL — ABNORMAL LOW (ref 12.0–15.0)
Immature Granulocytes: 0 %
Lymphocytes Relative: 35 %
Lymphs Abs: 2.3 10*3/uL (ref 0.7–4.0)
MCH: 27.9 pg (ref 26.0–34.0)
MCHC: 32.7 g/dL (ref 30.0–36.0)
MCV: 85.5 fL (ref 80.0–100.0)
Monocytes Absolute: 0.4 10*3/uL (ref 0.1–1.0)
Monocytes Relative: 6 %
Neutro Abs: 3.7 10*3/uL (ref 1.7–7.7)
Neutrophils Relative %: 57 %
Platelets: 349 10*3/uL (ref 150–400)
RBC: 4.01 MIL/uL (ref 3.87–5.11)
RDW: 14.5 % (ref 11.5–15.5)
WBC: 6.4 10*3/uL (ref 4.0–10.5)
nRBC: 0 % (ref 0.0–0.2)

## 2022-06-08 LAB — COMPREHENSIVE METABOLIC PANEL
ALT: 12 U/L (ref 0–44)
AST: 13 U/L — ABNORMAL LOW (ref 15–41)
Albumin: 3.4 g/dL — ABNORMAL LOW (ref 3.5–5.0)
Alkaline Phosphatase: 92 U/L (ref 38–126)
Anion gap: 7 (ref 5–15)
BUN: 10 mg/dL (ref 6–20)
CO2: 21 mmol/L — ABNORMAL LOW (ref 22–32)
Calcium: 8.2 mg/dL — ABNORMAL LOW (ref 8.9–10.3)
Chloride: 106 mmol/L (ref 98–111)
Creatinine, Ser: 0.82 mg/dL (ref 0.44–1.00)
GFR, Estimated: >60 mL/min (ref >60)
Glucose, Bld: 132 mg/dL — ABNORMAL HIGH (ref 70–99)
Potassium: 3.5 mmol/L (ref 3.5–5.1)
Sodium: 134 mmol/L — ABNORMAL LOW (ref 135–145)
Total Bilirubin: 0.3 mg/dL (ref 0.3–1.2)
Total Protein: 7.4 g/dL (ref 6.5–8.1)

## 2022-06-08 LAB — URINALYSIS, MICROSCOPIC (REFLEX): WBC, UA: >50 WBC/hpf (ref 0–5)

## 2022-06-08 LAB — PROTIME-INR
INR: 1.1 (ref 0.8–1.2)
Prothrombin Time: 14.5 seconds (ref 11.4–15.2)

## 2022-06-08 LAB — HCG, QUANTITATIVE, PREGNANCY: hCG, Beta Chain, Quant, S: 405 m[IU]/mL — ABNORMAL HIGH (ref <5)

## 2022-06-08 LAB — APTT: aPTT: 33 seconds (ref 24–36)

## 2022-06-08 LAB — PREGNANCY, URINE: Preg Test, Ur: POSITIVE — AB

## 2022-06-08 MED ORDER — CEPHALEXIN 500 MG PO CAPS: 500.0000 mg | ORAL_CAPSULE | Freq: Two times a day (BID) | ORAL | 0 refills | Status: AC

## 2022-06-08 MED ORDER — ONDANSETRON 4 MG PO TBDP: 4.0000 mg | ORAL_TABLET | Freq: Three times a day (TID) | ORAL | 0 refills | Status: AC | PRN

## 2022-06-08 MED ORDER — ONDANSETRON HCL 4 MG/2ML IJ SOLN
4.0000 mg | Freq: Once | INTRAMUSCULAR | Status: AC
  Administered 2022-06-08: 4 mg via INTRAVENOUS

## 2022-06-08 MED ORDER — SODIUM CHLORIDE 0.9 % IV SOLN
1.0000 g | Freq: Once | INTRAVENOUS | Status: AC
  Administered 2022-06-08: 1 g via INTRAVENOUS
  Filled 2022-06-08: qty 10

## 2022-06-08 MED ORDER — AZITHROMYCIN 250 MG PO TABS
1000.0000 mg | ORAL_TABLET | Freq: Once | ORAL | Status: AC
  Administered 2022-06-08: 1000 mg via ORAL
  Filled 2022-06-08: qty 4

## 2022-06-08 MED ORDER — ACETAMINOPHEN 500 MG PO TABS
1000.0000 mg | ORAL_TABLET | Freq: Once | ORAL | Status: AC
  Administered 2022-06-08: 1000 mg via ORAL
  Filled 2022-06-08: qty 2

## 2022-06-08 MED ORDER — METRONIDAZOLE 500 MG PO TABS
500.0000 mg | ORAL_TABLET | Freq: Once | ORAL | Status: AC
  Administered 2022-06-08: 500 mg via ORAL
  Filled 2022-06-08: qty 1

## 2022-06-08 MED ORDER — METRONIDAZOLE 500 MG PO TABS: 500.0000 mg | ORAL_TABLET | Freq: Two times a day (BID) | ORAL | 0 refills | Status: AC

## 2022-06-08 MED ORDER — ONDANSETRON HCL 4 MG/2ML IJ SOLN
4.0000 mg | Freq: Once | INTRAMUSCULAR | Status: DC
  Filled 2022-06-08: qty 2

## 2022-06-08 MED ORDER — MORPHINE SULFATE (PF) 4 MG/ML IV SOLN
4.0000 mg | Freq: Once | INTRAVENOUS | Status: DC
  Filled 2022-06-08: qty 1

## 2022-06-08 MED ORDER — KETOROLAC TROMETHAMINE 15 MG/ML IJ SOLN: 15.0000 mg | Freq: Once | INTRAMUSCULAR | Status: DC

## 2022-06-08 NOTE — ED Triage Notes (Signed)
Pt reports that she started having lower abdominal pain last night. Pt states that she had her period 2 weeks ago and that this morning she was covered in blood and felt like she was having contractions.

## 2022-06-08 NOTE — ED Notes (Signed)
Patient transported to Ultrasound 

## 2022-06-08 NOTE — Discharge Instructions (Addendum)
Go to the [facility] at [facility] on Saturday for repeat blood draw of the pregnancy hormone level (hcg).    Do not go into the Main ER. The address you are supposed to go to is listed below.   [facility] [address] [place],  [place]  [address] Get Driving Directions Main: [phone]  You could be having an early miscarriage, early pregnancy or possible ectopic which is why it is important you get this repeat blood draw done on Saturday as planned.  Take Tylenol and use heating packs or ice as needed for pain.  Take the Zofran as needed for nausea or vomiting.  You can also take the Flagyl and Keflex prescribed for bacteria in your urine and bacterial vaginosis.  Come back to the ER if you have any severe worsening pain, uncontrollable nausea and vomiting, bleeding through a pad every hour 3 or more hours, fainting episodes, or any other symptoms concerning to you.  You will need follow-up with an OB regarding your visit to the ER today.

## 2022-06-08 NOTE — ED Provider Notes (Signed)
[place] [facility] Provider Note   CSN: [id number] Arrival date & time: 06/08/22  0701     History  Chief Complaint  Patient presents with   Abdominal Pain    Yvette Miller is a 37 y.o. female.[id number] with PMH of asthma who presents with lower abdominal pain associated with vaginal bleeding.  Patient's last menstrual period was 2 weeks ago.  She has no history of abnormal uterine bleeding.  Denies any history of fibroids.  She had developed lower abdominal bilateral cramping since last night which increased and woke up this morning and blood.  She has changed her pad once since this morning.  She has had heavy vaginal bleeding.  No fevers, no chills, no vomiting, no other abnormal vaginal discharge, no urinary pain dysuria or hematuria.  History of D&C for miscarriage as well as cholecystectomy.  She took Goody powder and muscle relaxers without relief.  She had a pelvic exam performed in April which was negative for STD.  She did have STD back in March but that was treated.  She denies any new sexual partner since.   Abdominal Pain      Home Medications Prior to Admission medications   Medication Sig Start Date End Date Taking? Authorizing Provider  cephALEXin (KEFLEX) 500 MG capsule Take 1 capsule (500 mg total) by mouth 2 (two) times daily for 7 days. 06/08/22 06/15/22 Yes Mardene Sayer, MD  metroNIDAZOLE (FLAGYL) 500 MG tablet Take 1 tablet (500 mg total) by mouth 2 (two) times daily for 7 days. 06/08/22 06/15/22 Yes Mardene Sayer, MD  ondansetron (ZOFRAN-ODT) 4 MG disintegrating tablet Take 1 tablet (4 mg total) by mouth every 8 (eight) hours as needed. 06/08/22  Yes Mardene Sayer, MD      Allergies    Codeine, Doxycycline, and Hydrocodone-acetaminophen    Review of Systems   Review of Systems  Gastrointestinal:  Positive for abdominal pain.    Physical Exam Updated Vital Signs BP 107/78   Pulse 82   Temp 98.5 F  (36.9 C) (Core)   Resp 15   Ht 5\' 2"  (1.575 m)   Wt 116.1 kg   LMP 05/24/2022 (Exact Date)   SpO2 100%   BMI 46.82 kg/m  Physical Exam Constitutional: Alert and oriented.  Slightly uncomfortable but nontoxic no acute distress Eyes: Conjunctivae are normal. ENT      Head: Normocephalic and atraumatic. Cardiovascular: S1, S2, regular rate and rhythm Respiratory: Normal respiratory effort. Breath sounds are normal.  O2 sat 100 on RA Gastrointestinal: Soft, obese, suprapubic tenderness lower pelvic pelvic bilateral tenderness no localization, no rebound or guarding, no CVAT GU: performed pelvic exam with bedside RN Candy [place]. Minimal bleeding from cervix. No CMT. Os closed. Musculoskeletal: Normal range of motion in all extremities. Neurologic: Normal speech and language. No gross focal neurologic deficits are appreciated. Skin: Skin is warm, dry and intact. No rash noted. Psychiatric: Mood and affect are normal. Speech and behavior are normal.  ED Results / Procedures / Treatments   Labs (all labs ordered are listed, but only abnormal results are displayed) Labs Reviewed  WET PREP, GENITAL - Abnormal; Notable for the following components:      Result Value   Clue Cells Wet Prep HPF POC PRESENT (*)    WBC, Wet Prep HPF POC >=10 (*)    All other components within normal limits  URINALYSIS, ROUTINE W REFLEX MICROSCOPIC - Abnormal; Notable for the following components:  Color, Urine ORANGE (*)    APPearance TURBID (*)    Hgb urine dipstick LARGE (*)    Protein, ur 100 (*)    Leukocytes,Ua LARGE (*)    All other components within normal limits  PREGNANCY, URINE - Abnormal; Notable for the following components:   Preg Test, Ur POSITIVE (*)    All other components within normal limits  COMPREHENSIVE METABOLIC PANEL - Abnormal; Notable for the following components:   Sodium 134 (*)    CO2 21 (*)    Glucose, Bld 132 (*)    Calcium 8.2 (*)    Albumin 3.4 (*)    AST 13 (*)     All other components within normal limits  CBC WITH DIFFERENTIAL/PLATELET - Abnormal; Notable for the following components:   Hemoglobin 11.2 (*)    HCT 34.3 (*)    All other components within normal limits  HCG, QUANTITATIVE, PREGNANCY - Abnormal; Notable for the following components:   hCG, Beta Chain, Quant, S 405 (*)    All other components within normal limits  URINALYSIS, MICROSCOPIC (REFLEX) - Abnormal; Notable for the following components:   Bacteria, UA MANY (*)    All other components within normal limits  PROTIME-INR  APTT  GC/CHLAMYDIA PROBE AMP ([place]) NOT AT [facility]    EKG None  Radiology US OB LESS THAN 14 WEEKS WITH OB TRANSVAGINAL  Result Date: 06/08/2022 CLINICAL DATA:  Vaginal bleeding with sudden onset of cramping. EXAM: OBSTETRIC <14 WK [place] AND TRANSVAGINAL OB US TECHNIQUE: Both transabdominal and transvaginal ultrasound examinations were performed for complete evaluation of the gestation as well as the maternal uterus, adnexal regions, and pelvic cul-de-sac. Transvaginal technique was performed to assess early pregnancy. COMPARISON:  None Available. FINDINGS: Intrauterine gestational sac: None Yolk sac:  Visualized. Embryo:  Visualized. Maternal uterus/adnexae: Subchorionic hemorrhage: None Right ovary: Normal Left ovary: 2.8 cm anechoic cyst with increased through transmission noted compatible with a simple cyst. Other :None Free fluid:  None IMPRESSION: 1. No intrauterine gestational sac, yolk sac, or fetal pole identified. Differential considerations include intrauterine pregnancy too early to be sonographically visualized, missed abortion, or ectopic pregnancy. Followup ultrasound is recommended in 10-14 days for further evaluation. Electronically Signed   By: Signa Kell M.D.   On: 06/08/2022 10:20    Procedures Procedures    Medications Ordered in ED Medications  acetaminophen (TYLENOL) tablet 1,000 mg (1,000 mg Oral Given 06/08/22 0801)  ondansetron  (ZOFRAN) injection 4 mg (4 mg Intravenous Given 06/08/22 0802)  cefTRIAXone (ROCEPHIN) 1 g in sodium chloride 0.9 % 100 mL IVPB (0 g Intravenous Stopped 06/08/22 1046)  metroNIDAZOLE (FLAGYL) tablet 500 mg (500 mg Oral Given 06/08/22 0854)  azithromycin (ZITHROMAX) tablet 1,000 mg (1,000 mg Oral Given 06/08/22 0854)    ED Course/ Medical Decision Making/ A&P Clinical Course as of 06/08/22 1132  Thu Jun 08, 2022  4098 Labs reviewed by me normal white blood cell count 6.4.  Mild anemia hemoglobin 11.2.  Normal platelet count and coags not concern for coagulopathy.  Patient's pregnancy test was positive concerning for possible ectopic or miscarriage.  Adjusted ultrasound to evaluate.  Patient's wet prep positive with clue cells present and greater than equal to 10 white blood cells.  Her UA is dirty there are many squames present and also greater than 50 WBCs, RBCs large leukocyte Estrace and no nitrite.  Will cover for possible UTI/bacteriuria in pregnancy/possible STI with Rocephin shot, 1x 1g Azithromycin, covering BV with Flagyl. [VB]  0856 hCG quant 405.  Last menstrual period was May 24, 2022. [VB]    Clinical Course User Index [VB] Mardene Sayer, MD                             Medical Decision Making  Contessa Ursin is a 37 y.o. female.[id number] with PMH of asthma who presents with lower abdominal pain associated with vaginal bleeding.   Patient's lower pelvic cramping associated with abnormal vaginal bleeding and positive pregnancy test today.  On exam, cervical os is closed.  Differential for patient's presentation includes but not limited to ectopic pregnancy, missed abortion, threatened abortion among multiple other etiologies.  Labs reviewed by me normal white blood cell count 6.4.  Mild anemia hemoglobin 11.2.  Normal platelet count and coags not concern for coagulopathy.  Patient's wet prep positive with clue cells present and greater than equal to 10 white blood cells.  Her UA is  dirty there are many squames present and also greater than 50 WBCs, RBCs large leukocyte Estrace and no nitrite.  Will cover for possible UTI/bacteriuria in pregnancy/possible STI with Rocephin shot, 1x 1g Azithromycin, covering BV with Flagyl. [VB] hCG quant 405.  Last menstrual period was May 24, 2022. [VB]  Ultrasound indeterminant, no evidence of free fluid or mass concerning for ruptured ectopic.  She will still need ectopic rule out.  I spoke with Dr. Charlotta Newton on-call OB/GYN who recommends patient present to MAU on Saturday for 48-hour quant check.  I discussed strict return precautions with patient.  I have discharged with Keflex for bacteriuria in pregnancy as well as Flagyl for BV.  Patient discharged in stable condition.   Amount and/or Complexity of Data Reviewed Labs: ordered. Radiology: ordered.  Risk OTC drugs. Prescription drug management.     Final Clinical Impression(s) / ED Diagnoses Final diagnoses:  Bacteriuria in pregnancy  Bacterial vaginosis  Bleeding in early pregnancy    Rx / DC Orders ED Discharge Orders          Ordered    cephALEXin (KEFLEX) 500 MG capsule  2 times daily        06/08/22 1047    metroNIDAZOLE (FLAGYL) 500 MG tablet  2 times daily        06/08/22 1047    ondansetron (ZOFRAN-ODT) 4 MG disintegrating tablet  Every 8 hours PRN        06/08/22 1047              Mardene Sayer, MD 06/08/22 1132

## 2022-06-09 LAB — GC/CHLAMYDIA PROBE AMP (~~LOC~~) NOT AT ARMC
Chlamydia: NEGATIVE
Comment: NEGATIVE
Comment: NORMAL
Neisseria Gonorrhea: POSITIVE — AB

## 2022-06-10 ENCOUNTER — Other Ambulatory Visit: Payer: Self-pay | Admitting: Family Medicine

## 2022-06-10 ENCOUNTER — Other Ambulatory Visit (HOSPITAL_COMMUNITY)
Admission: RE | Admit: 2022-06-10 | Discharge: 2022-06-10 | Disposition: A | Discharge status: Home / Self Care | Payer: Medicaid Other | Source: Ambulatory Visit | Attending: Obstetrics & Gynecology | Admitting: Obstetrics & Gynecology

## 2022-06-10 ENCOUNTER — Encounter (HOSPITAL_COMMUNITY): Payer: Self-pay

## 2022-06-10 ENCOUNTER — Inpatient Hospital Stay (HOSPITAL_COMMUNITY)
Admission: AD | Admit: 2022-06-10 | Discharge: 2022-06-10 | Disposition: A | Discharge status: Home / Self Care | Payer: Medicaid Other | Attending: Obstetrics & Gynecology | Admitting: Obstetrics & Gynecology

## 2022-06-10 DIAGNOSIS — R7989 Other specified abnormal findings of blood chemistry: Secondary | ICD-10-CM

## 2022-06-10 DIAGNOSIS — Z3A01 Less than 8 weeks gestation of pregnancy: Secondary | ICD-10-CM | POA: Insufficient documentation

## 2022-06-10 DIAGNOSIS — O200 Threatened abortion: Secondary | ICD-10-CM | POA: Insufficient documentation

## 2022-06-10 LAB — HCG, QUANTITATIVE, PREGNANCY: hCG, Beta Chain, Quant, S: 71 m[IU]/mL — ABNORMAL HIGH (ref <5)

## 2022-06-10 NOTE — MAU Provider Note (Signed)
MAU Provider Note  History  [id number]  Arrival date and time: 06/10/22 1029   Chief Complaint  Patient presents with   Repeat lab work     HPI Yvette Miller is a 37 y.o. G1P0 who presents for follow up HCG. IN ED had HCG at 405, US showed No intrauterine gestational sac, yolk sac, or fetal pole.  She has no abdominal pain, vaginal bleeding, shortness of breath, chest pain.  Vaginal bleeding: No LOF: No Fetal Movement: No Contractions: No     OB History  Gravida Para Term Preterm AB Living  1            SAB IAB Ectopic Multiple Live Births               # Outcome Date GA Lbr Len/2nd Weight Sex Delivery Anes PTL Lv  1 Current              Past Medical History:  Diagnosis Date   Asthma    Bronchitis     Past Surgical History:  Procedure Laterality Date   CHOLECYSTECTOMY     DILATION AND CURETTAGE OF UTERUS      No family history on file.  Social History   Socioeconomic History   Marital status: Single    Spouse name: Not on file   Number of children: Not on file   Years of education: Not on file   Highest education level: Not on file  Occupational History   Not on file  Tobacco Use   Smoking status: Never   Smokeless tobacco: Never  Substance and Sexual Activity   Alcohol use: No   Drug use: No   Sexual activity: Yes    Birth control/protection: None  Other Topics Concern   Not on file  Social History Narrative   Not on file   Social Determinants of Health   Financial Resource Strain: Not on file  Food Insecurity: Not on file  Transportation Needs: Not on file  Physical Activity: Not on file  Stress: Not on file  Social Connections: Not on file  Intimate Partner Violence: Not on file    Allergies  Allergen Reactions   Codeine Nausea And Vomiting   Doxycycline Hives    Swelling, hives,    Hydrocodone-Acetaminophen Nausea And Vomiting and Rash    No current facility-administered medications on file prior to encounter.   Current  Outpatient Medications on File Prior to Encounter  Medication Sig Dispense Refill   cephALEXin (KEFLEX) 500 MG capsule Take 1 capsule (500 mg total) by mouth 2 (two) times daily for 7 days. 13 capsule 0   metroNIDAZOLE (FLAGYL) 500 MG tablet Take 1 tablet (500 mg total) by mouth 2 (two) times daily for 7 days. 13 tablet 0   ondansetron (ZOFRAN-ODT) 4 MG disintegrating tablet Take 1 tablet (4 mg total) by mouth every 8 (eight) hours as needed. 20 tablet 0    ROS: Pertinent positives and negative per HPI, all others reviewed and negative  Physical Exam   BP 117/79 (BP Location: Right Arm)   Pulse 86   Temp 98.1 F (36.7 C) (Oral)   Resp 18   Ht 5\' 2"  (1.575 m)   Wt 120.4 kg   LMP 05/24/2022 (Exact Date)   SpO2 99%   BMI 48.54 kg/m   Patient Vitals for the past 24 hrs:  BP Temp Temp src Pulse Resp SpO2 Height Weight  06/10/22 1042 117/79 98.1 F (36.7 C) Oral 86 18 99 %  5\' 2"  (1.575 m) 120.4 kg    Physical Exam Vitals reviewed.  Constitutional:      Appearance: Normal appearance.  HENT:     Head: Normocephalic and atraumatic.     Right Ear: External ear normal.     Left Ear: External ear normal.  Cardiovascular:     Rate and Rhythm: Normal rate and regular rhythm.  Pulmonary:     Effort: Pulmonary effort is normal.     Breath sounds: Normal breath sounds.  Abdominal:     General: Abdomen is flat.     Palpations: Abdomen is soft.  Skin:    General: Skin is warm.     Capillary Refill: Capillary refill takes less than 2 seconds.  Psychiatric:        Mood and Affect: Mood normal.     Cervical Exam  Not applicable  Bedside Ultrasound not done  FHT Not applicable  Labs Results for orders placed or performed during the hospital encounter of 06/10/22 (from the past 24 hour(s))  hCG, quantitative, pregnancy     Status: Abnormal   Collection Time: 06/10/22 11:08 AM  Result Value Ref Range   hCG, Beta Chain, Quant, S 71 (H) <5 mIU/mL    Imaging No results  found.  MAU Course  MDM: low  This patient presents to the ED for concern of   Chief Complaint  Patient presents with   Repeat lab work     These complains involves an extensive number of treatment options, and is a complaint that carries with it a high risk of complications and morbidity.  The differential diagnosis for  1.  Pregnancy of unknown location INCLUDES pregnancy loss, ectopic pregnancy  Co morbidities that complicate the patient evaluation: None  Additional history obtained from N/A  Interpreter services used: not applicable  External records from outside source obtained and reviewed including CareEverywhere  Lab Tests: BHCG  I ordered, and personally interpreted labs.  The pertinent results include: Beta-hCG  Imaging Studies ordered: Not applicable  Cardiac Testing/Monitoring:  EKG was not ordered today. I personally reviewed or consulted with a physician to help with intrepretation.    Medicines ordered and prescription drug management:  Medications:    Reevaluation of the patient after these medicines showed that the patient improved I have reviewed the patients home medicines and have made adjustments as needed  Test Considered: None  Critical Interventions: None  Consultations Obtained: Not applicable  MAU Course: -hCG 71 down from 405.  Ultrasound reviewed from 5/23 which showed no gestational sac or fetal pole or yolk sac. -Made follow-up appointment for hCG to be trended down.  Also made follow-up appointment for ultrasound recheck.  After the interventions noted above, I reevaluated the patient and found that they have :improved  Dispostion: discharged   Assessment and Plan  1. Elevated serum hCG - Discharge patient  2. Abortion, threatened, early pregnancy - Discharge patient  -hCG 71 down from 405.  Ultrasound reviewed from 5/23 which showed no gestational sac or fetal pole or yolk sac. -Made follow-up appointment for hCG to be  trended down.  Also made follow-up appointment for ultrasound recheck.  Discharge Instructions     Discharge patient   Complete by: As directed    Discharge disposition: 01-Home or Self Care   Discharge patient date: 06/10/2022      Allergies as of 06/10/2022       Reactions   Codeine Nausea And Vomiting   Doxycycline Hives   Swelling,  hives,    Hydrocodone-acetaminophen Nausea And Vomiting, Rash        Medication List     TAKE these medications    cephALEXin 500 MG capsule Commonly known as: KEFLEX Take 1 capsule (500 mg total) by mouth 2 (two) times daily for 7 days.   metroNIDAZOLE 500 MG tablet Commonly known as: FLAGYL Take 1 tablet (500 mg total) by mouth 2 (two) times daily for 7 days.   ondansetron 4 MG disintegrating tablet Commonly known as: ZOFRAN-ODT Take 1 tablet (4 mg total) by mouth every 8 (eight) hours as needed.        Myrtie Hawk, DO FMOB Fellow, Faculty practice [facility], Center for [facility] 06/10/22  1:13 PM

## 2022-06-10 NOTE — MAU Note (Signed)
.  Yvette Miller is a 37 y.o. at Unknown here in MAU reporting: here for repeat HCG after begin seen in the ER for vaginal bleeding. Denies pain or bleeding today.   Vaginal Bleeding Vag. Bleeding: None  Abnormal discharge/LOF: Membranes Amount: None and Amount: None  Fetal Movement: N/A  LMP: Patient's last menstrual period was 05/24/2022 (exact date). Pain score:  Pain Score: 0-No pain     Vitals:   06/10/22 1042  BP: 117/79  Pulse: 86  Resp: 18  Temp: 98.1 F (36.7 C)  SpO2: 99%      FHT:  N/A  OB Office:  N/A Lab orders placed from triage: N/A

## 2022-06-12 ENCOUNTER — Other Ambulatory Visit (HOSPITAL_COMMUNITY): Payer: Medicaid Other | Attending: Obstetrics & Gynecology

## 2022-06-18 ENCOUNTER — Encounter (HOSPITAL_COMMUNITY): Payer: Self-pay | Admitting: Family Medicine

## 2022-06-18 ENCOUNTER — Inpatient Hospital Stay (HOSPITAL_COMMUNITY)
Admission: AD | Admit: 2022-06-18 | Discharge: 2022-06-18 | Disposition: A | Discharge status: Home / Self Care | Payer: Medicaid Other | Attending: Family Medicine | Admitting: Family Medicine

## 2022-06-18 DIAGNOSIS — O039 Complete or unspecified spontaneous abortion without complication: Secondary | ICD-10-CM | POA: Diagnosis present

## 2022-06-18 DIAGNOSIS — Z6790 Unspecified blood type, Rh positive: Secondary | ICD-10-CM

## 2022-06-18 DIAGNOSIS — Z3A01 Less than 8 weeks gestation of pregnancy: Secondary | ICD-10-CM

## 2022-06-18 LAB — CBC
HCT: 33.9 % — ABNORMAL LOW (ref 36.0–46.0)
Hemoglobin: 11.1 g/dL — ABNORMAL LOW (ref 12.0–15.0)
MCH: 28.4 pg (ref 26.0–34.0)
MCHC: 32.7 g/dL (ref 30.0–36.0)
MCV: 86.7 fL (ref 80.0–100.0)
Platelets: 323 10*3/uL (ref 150–400)
RBC: 3.91 MIL/uL (ref 3.87–5.11)
RDW: 14.8 % (ref 11.5–15.5)
WBC: 7.6 10*3/uL (ref 4.0–10.5)
nRBC: 0 % (ref 0.0–0.2)

## 2022-06-18 LAB — HCG, QUANTITATIVE, PREGNANCY: hCG, Beta Chain, Quant, S: 4 m[IU]/mL (ref <5)

## 2022-06-18 LAB — ABO/RH: ABO/RH(D): O POS

## 2022-06-18 MED ORDER — KETOROLAC TROMETHAMINE 10 MG PO TABS
10.0000 mg | ORAL_TABLET | Freq: Four times a day (QID) | ORAL | 0 refills | Status: DC | PRN
  Filled 2022-06-18: qty 20, 5d supply, fill #0

## 2022-06-18 MED ORDER — KETOROLAC TROMETHAMINE 60 MG/2ML IM SOLN
60.0000 mg | Freq: Once | INTRAMUSCULAR | Status: AC
  Administered 2022-06-18: 60 mg via INTRAMUSCULAR
  Filled 2022-06-18: qty 2

## 2022-06-18 MED ORDER — KETOROLAC TROMETHAMINE 10 MG PO TABS: 10.0000 mg | ORAL_TABLET | Freq: Four times a day (QID) | ORAL | 0 refills | Status: AC | PRN

## 2022-06-18 NOTE — MAU Note (Signed)
.  Yvette Miller is a 37 y.o. at Unknown here in MAU reporting: "I am having a miscarriage" Last Saturday HCG level dropping 71. HCG level 16 on Tuesday at [facility] appointment. Vaginal bleeding light has used only 1 pad not saturated with dark red bleeding  Onset of complaint: 0000 Pain score: 10 lower abdomen around to lower back Vitals:   06/18/22 0127  BP: 124/82  Pulse: 84  Resp: 18  Temp: 98 F (36.7 C)  SpO2: 95%

## 2022-06-18 NOTE — MAU Provider Note (Signed)
History     CSN: [id number]  Arrival date and time: 06/18/22 0106   Event Date/Time   First Provider Initiated Contact with Patient 06/18/22 0157      Chief Complaint  Patient presents with   Abdominal Pain   Vaginal Bleeding   36 y.o. [id number] @[redacted]w[redacted]d  by LMP presenting with abdominal cramping and VB. Reports onset of pain around midnight, woke her from sleep. Rates pain 10/10. Has not taken anything. Reports light amount of dark VB, not filling a pad. She was seen by her OBGYN on 5/23, 5/25/, and 5/28 and had qhcg of 405, 71, and 16.    OB History     Gravida  7   Para  3   Term  1   Preterm  2   AB  3   Living  3      SAB  2   IAB  1   Ectopic      Multiple      Live Births  3           Past Medical History:  Diagnosis Date   Asthma    Bronchitis     Past Surgical History:  Procedure Laterality Date   CHOLECYSTECTOMY     DILATION AND CURETTAGE OF UTERUS      No family history on file.  Social History   Tobacco Use   Smoking status: Never   Smokeless tobacco: Never  Substance Use Topics   Alcohol use: No   Drug use: No    Allergies:  Allergies  Allergen Reactions   Codeine Nausea And Vomiting   Doxycycline Hives    Swelling, hives,    Flagyl [Metronidazole] Hives and Swelling    Per patient report broke out into hives and became swollen; reports no SOB     Keflex [Cephalexin] Hives and Swelling    Per patient report broke out in hives and became swollen; denies SOB    Hydrocodone-Acetaminophen Nausea And Vomiting and Rash    Medications Prior to Admission  Medication Sig Dispense Refill Last Dose   ondansetron (ZOFRAN-ODT) 4 MG disintegrating tablet Take 1 tablet (4 mg total) by mouth every 8 (eight) hours as needed. 20 tablet 0     Review of Systems  Gastrointestinal:  Positive for abdominal pain.  Genitourinary:  Positive for vaginal bleeding.   Physical Exam   Blood pressure 126/70, pulse 81, temperature 98 F (36.7  C), temperature source Oral, resp. rate 18, height 5\' 2"  (1.575 m), weight 120 kg, last menstrual period 05/24/2022, SpO2 95 %.  Physical Exam Vitals and nursing note reviewed.  Constitutional:      General: She is not in acute distress.    Appearance: Normal appearance.  HENT:     Head: Normocephalic and atraumatic.  Cardiovascular:     Rate and Rhythm: Normal rate.  Pulmonary:     Effort: Pulmonary effort is normal. No respiratory distress.  Abdominal:     General: There is no distension.     Palpations: Abdomen is soft. There is no mass.     Tenderness: There is no abdominal tenderness. There is no guarding or rebound.     Hernia: No hernia is present.  Musculoskeletal:        General: Normal range of motion.     Cervical back: Normal range of motion.  Skin:    General: Skin is warm and dry.  Neurological:     General: No focal deficit present.  Mental Status: She is alert and oriented to person, place, and time.  Psychiatric:        Mood and Affect: Mood normal.        Behavior: Behavior normal.    Results for orders placed or performed during the hospital encounter of 06/18/22 (from the past 24 hour(s))  CBC     Status: Abnormal   Collection Time: 06/18/22  2:00 AM  Result Value Ref Range   WBC 7.6 4.0 - 10.5 K/uL   RBC 3.91 3.87 - 5.11 MIL/uL   Hemoglobin 11.1 (L) 12.0 - 15.0 g/dL   HCT 78.2 (L) 95.6 - 21.3 %   MCV 86.7 80.0 - 100.0 fL   MCH 28.4 26.0 - 34.0 pg   MCHC 32.7 30.0 - 36.0 g/dL   RDW 08.6 57.8 - 46.9 %   Platelets 323 150 - 400 K/uL   nRBC 0.0 0.0 - 0.2 %  hCG, quantitative, pregnancy     Status: None   Collection Time: 06/18/22  2:00 AM  Result Value Ref Range   hCG, Beta Chain, Quant, S 4 <5 mIU/mL  ABO/Rh     Status: None   Collection Time: 06/18/22  2:00 AM  Result Value Ref Range   ABO/RH(D) O POS    No rh immune globuloin      NOT A RH IMMUNE GLOBULIN CANDIDATE, PT RH POSITIVE Performed at [facility], [address]  [place], [place] [address]    MAU Course  Procedures Toradol  MDM Labs ordered and reviewed. SAB confirmed. Pain improved with meds. Discussed findings with pt, condolences given. Rec f/u with OBGYN I 1-2 weeks, she has appt. Stable for discharge home.   Assessment and Plan   1. SAB (spontaneous abortion)   2. Blood type, Rh positive    Discharge home Follow up at [facility]  Pelvic rest Bleeding precautions Rx Toradol  Allergies as of 06/18/2022       Reactions   Codeine Nausea And Vomiting   Doxycycline Hives   Swelling, hives,    Flagyl [metronidazole] Hives, Swelling   Per patient report broke out into hives and became swollen; reports no SOB    Keflex [cephalexin] Hives, Swelling   Per patient report broke out in hives and became swollen; denies SOB   Hydrocodone-acetaminophen Nausea And Vomiting, Rash        Medication List     TAKE these medications    ketorolac 10 MG tablet Commonly known as: TORADOL Take 1 tablet (10 mg total) by mouth every 6 (six) hours as needed.   ondansetron 4 MG disintegrating tablet Commonly known as: ZOFRAN-ODT Take 1 tablet (4 mg total) by mouth every 8 (eight) hours as needed.         Donette Larry, CNM 06/18/2022, 3:11 AM

## 2022-06-19 ENCOUNTER — Other Ambulatory Visit (HOSPITAL_BASED_OUTPATIENT_CLINIC_OR_DEPARTMENT_OTHER): Payer: Self-pay

## 2022-06-19 ENCOUNTER — Ambulatory Visit (HOSPITAL_COMMUNITY): Payer: Medicaid Other

## 2022-08-26 ENCOUNTER — Encounter (HOSPITAL_BASED_OUTPATIENT_CLINIC_OR_DEPARTMENT_OTHER): Payer: Self-pay

## 2022-08-26 ENCOUNTER — Other Ambulatory Visit: Payer: Self-pay

## 2022-08-26 ENCOUNTER — Emergency Department (HOSPITAL_BASED_OUTPATIENT_CLINIC_OR_DEPARTMENT_OTHER)
Admission: EM | Admit: 2022-08-26 | Discharge: 2022-08-26 | Disposition: A | Discharge status: Home / Self Care | Payer: Medicaid Other | Source: Home / Self Care | Attending: Emergency Medicine | Admitting: Emergency Medicine

## 2022-08-26 DIAGNOSIS — R519 Headache, unspecified: Secondary | ICD-10-CM | POA: Diagnosis present

## 2022-08-26 DIAGNOSIS — L0291 Cutaneous abscess, unspecified: Secondary | ICD-10-CM

## 2022-08-26 DIAGNOSIS — L02811 Cutaneous abscess of head [any part, except face]: Secondary | ICD-10-CM | POA: Insufficient documentation

## 2022-08-26 MED ORDER — AMOXICILLIN-POT CLAVULANATE 875-125 MG PO TABS: 1.0000 | ORAL_TABLET | Freq: Once | ORAL | Status: DC

## 2022-08-26 MED ORDER — AMOXICILLIN-POT CLAVULANATE 875-125 MG PO TABS: 1.0000 | ORAL_TABLET | Freq: Two times a day (BID) | ORAL | 0 refills | Status: AC

## 2022-08-26 MED ORDER — SULFAMETHOXAZOLE-TRIMETHOPRIM 800-160 MG PO TABS: 1.0000 | ORAL_TABLET | Freq: Once | ORAL | Status: DC

## 2022-08-26 MED ORDER — SULFAMETHOXAZOLE-TRIMETHOPRIM 800-160 MG PO TABS: 1.0000 | ORAL_TABLET | Freq: Two times a day (BID) | ORAL | 0 refills | Status: AC

## 2022-08-26 NOTE — ED Triage Notes (Signed)
The patient has an abscess to the back of her head for a few months. No fever.

## 2022-08-26 NOTE — ED Notes (Signed)
Pt left prior to medication administration, discharge VS & discharge summary.

## 2022-08-26 NOTE — Discharge Instructions (Addendum)
It was a pleasure taking care of you today!   You will be sent a prescription for Doxycycline and Keflex take as prescribed. Attached is information for the on-call surgery group, call and set up a follow up appointment regarding todays ED visit. Return to the ED if you are experiencing increasing/worsening symptoms.

## 2022-08-26 NOTE — ED Provider Notes (Signed)
[place] [facility] Provider Note   CSN: [id number] Arrival date & time: 08/26/22  1328     History  Chief Complaint  Patient presents with   Abscess    Raechel Dunkleberger is a 37 y.o. female with a PMHx of HS, who presents to the ED with concerns for abscess to the back of her head x 3 months. Notes that the area drained 3 weeks ago. Has associated headache. Tried Goody Powder at home for her symptoms. Denies fever, blurred vision, diplopia.   The history is provided by the patient. No language interpreter was used.       Home Medications Prior to Admission medications   Medication Sig Start Date End Date Taking? Authorizing Provider  amoxicillin-clavulanate (AUGMENTIN) 875-125 MG tablet Take 1 tablet by mouth every 12 (twelve) hours. 08/26/22  Yes Shammara Jarrett A, PA-C  sulfamethoxazole-trimethoprim (BACTRIM DS) 800-160 MG tablet Take 1 tablet by mouth 2 (two) times daily for 5 days. 08/26/22 08/31/22 Yes Russell Engelstad A, PA-C  ketorolac (TORADOL) 10 MG tablet Take 1 tablet (10 mg total) by mouth every 6 (six) hours as needed. 06/18/22   Donette Larry, CNM  ondansetron (ZOFRAN-ODT) 4 MG disintegrating tablet Take 1 tablet (4 mg total) by mouth every 8 (eight) hours as needed. 06/08/22   Mardene Sayer, MD      Allergies    Codeine, Doxycycline, Flagyl [metronidazole], Keflex [cephalexin], and Hydrocodone-acetaminophen    Review of Systems   Review of Systems  All other systems reviewed and are negative.   Physical Exam Updated Vital Signs BP (!) 130/100 (BP Location: Left Arm)   Pulse 76   Temp 97.9 F (36.6 C) (Oral)   Resp 20   Ht 5\' 2"  (1.575 m)   Wt 116.1 kg   LMP 08/17/2022 (Exact Date)   SpO2 99%   Breastfeeding Unknown   BMI 46.82 kg/m  Physical Exam Vitals and nursing note reviewed.  Constitutional:      General: She is not in acute distress.    Appearance: Normal appearance.  Eyes:     General: No scleral  icterus.    Extraocular Movements: Extraocular movements intact.  Cardiovascular:     Rate and Rhythm: Normal rate.  Pulmonary:     Effort: Pulmonary effort is normal. No respiratory distress.  Abdominal:     Palpations: Abdomen is soft. There is no mass.     Tenderness: There is no abdominal tenderness.  Musculoskeletal:        General: Normal range of motion.     Cervical back: Neck supple.  Skin:    General: Skin is warm and dry.     Findings: No rash.     Comments: multiple areas of fluctuance noted to posterior head, no overlying erythema noted.  No appreciable induration noted to these areas.  See picture below.  Neurological:     Mental Status: She is alert.     Sensory: Sensation is intact.     Motor: Motor function is intact.  Psychiatric:        Behavior: Behavior normal.          ED Results / Procedures / Treatments   Labs (all labs ordered are listed, but only abnormal results are displayed) Labs Reviewed - No data to display  EKG None  Radiology No results found.  Procedures Procedures    Medications Ordered in ED Medications  amoxicillin-clavulanate (AUGMENTIN) 875-125 MG per tablet 1 tablet (1 tablet  Oral Not Given 08/26/22 1720)  sulfamethoxazole-trimethoprim (BACTRIM DS) 800-160 MG per tablet 1 tablet (1 tablet Oral Not Given 08/26/22 1720)    ED Course/ Medical Decision Making/ A&P Clinical Course as of 08/26/22 1856  Sat Aug 26, 2022  1632 Discussed with patient discharge treatment plan. Answered all available questions. Pt appears safe for discharge at this time.  [SB]  1718 Went to discuss with patient antibiotics, however, patient not in the room. [SB]    Clinical Course User Index [SB] Ennifer Harston A, PA-C                                 Medical Decision Making Risk Prescription drug management.   Pt presents with concerns for abscess to her posterior head onset 3 months. Area has been slow growing. Has been referred to a HS  specialist with an appointment in the new year. Vital signs, pt afebrile. On exam, pt with multiple areas of fluctuance noted to posterior head, no overlying erythema noted.  No appreciable induration noted to these areas. No acute cardiovascular, respiratory, abdominal exam findings. Differential diagnosis includes abscess, cellulitis, cyst.   Disposition: Presenting suspicious for abscess to posterior scalp.  All concerns this time for cellulitis or cyst.  Patient left the emergency department prior to discussion of treatment with Augmentin and Bactrim and receiving first dose in the emergency department.  Patient provided with information for dermatologist for follow-up regarding today's ED visit.  After consideration of the diagnostic results and the patients response to treatment, I feel that the patient would benefit from Discharge home.  Augmentin and Bactrim prescription sent to patient's pharmacy.  Supportive care measures and strict return precautions discussed with patient at bedside. Pt acknowledges and verbalizes understanding. Pt appears safe for discharge. Follow up as indicated in discharge paperwork.    This chart was dictated using voice recognition software, Dragon. Despite the best efforts of this provider to proofread and correct errors, errors may still occur which can change documentation meaning.   Final Clinical Impression(s) / ED Diagnoses Final diagnoses:  Abscess    Rx / DC Orders ED Discharge Orders          Ordered    sulfamethoxazole-trimethoprim (BACTRIM DS) 800-160 MG tablet  2 times daily        08/26/22 1718    amoxicillin-clavulanate (AUGMENTIN) 875-125 MG tablet  Every 12 hours        08/26/22 1737              Rylei Masella A, PA-C 08/26/22 1856    Charlynne Pander, MD 08/26/22 [phone]

## 2023-03-07 ENCOUNTER — Emergency Department (HOSPITAL_BASED_OUTPATIENT_CLINIC_OR_DEPARTMENT_OTHER)
Admission: EM | Admit: 2023-03-07 | Discharge: 2023-03-07 | Disposition: A | Discharge status: Home / Self Care | Payer: Medicaid Other

## 2023-03-07 ENCOUNTER — Other Ambulatory Visit: Payer: Self-pay

## 2023-03-07 DIAGNOSIS — R000 Tachycardia, unspecified: Secondary | ICD-10-CM | POA: Insufficient documentation

## 2023-03-07 DIAGNOSIS — E871 Hypo-osmolality and hyponatremia: Secondary | ICD-10-CM | POA: Diagnosis not present

## 2023-03-07 DIAGNOSIS — N939 Abnormal uterine and vaginal bleeding, unspecified: Secondary | ICD-10-CM | POA: Diagnosis present

## 2023-03-07 LAB — URINALYSIS, MICROSCOPIC (REFLEX): RBC / HPF: >50 RBC/hpf (ref 0–5)

## 2023-03-07 LAB — BASIC METABOLIC PANEL
Anion gap: 9 (ref 5–15)
BUN: 14 mg/dL (ref 6–20)
CO2: 19 mmol/L — ABNORMAL LOW (ref 22–32)
Calcium: 8.6 mg/dL — ABNORMAL LOW (ref 8.9–10.3)
Chloride: 106 mmol/L (ref 98–111)
Creatinine, Ser: 0.89 mg/dL (ref 0.44–1.00)
GFR, Estimated: >60 mL/min (ref >60)
Glucose, Bld: 125 mg/dL — ABNORMAL HIGH (ref 70–99)
Potassium: 3.6 mmol/L (ref 3.5–5.1)
Sodium: 134 mmol/L — ABNORMAL LOW (ref 135–145)

## 2023-03-07 LAB — CBC WITH DIFFERENTIAL/PLATELET
Abs Immature Granulocytes: 0.04 10*3/uL (ref 0.00–0.07)
Basophils Absolute: 0 10*3/uL (ref 0.0–0.1)
Basophils Relative: 1 %
Eosinophils Absolute: 0.1 10*3/uL (ref 0.0–0.5)
Eosinophils Relative: 1 %
HCT: 35.5 % — ABNORMAL LOW (ref 36.0–46.0)
Hemoglobin: 11.8 g/dL — ABNORMAL LOW (ref 12.0–15.0)
Immature Granulocytes: 1 %
Lymphocytes Relative: 24 %
Lymphs Abs: 1.7 10*3/uL (ref 0.7–4.0)
MCH: 28.6 pg (ref 26.0–34.0)
MCHC: 33.2 g/dL (ref 30.0–36.0)
MCV: 86 fL (ref 80.0–100.0)
Monocytes Absolute: 0.7 10*3/uL (ref 0.1–1.0)
Monocytes Relative: 10 %
Neutro Abs: 4.4 10*3/uL (ref 1.7–7.7)
Neutrophils Relative %: 63 %
Platelets: 293 10*3/uL (ref 150–400)
RBC: 4.13 MIL/uL (ref 3.87–5.11)
RDW: 14.1 % (ref 11.5–15.5)
WBC: 6.9 10*3/uL (ref 4.0–10.5)
nRBC: 0 % (ref 0.0–0.2)

## 2023-03-07 LAB — URINALYSIS, ROUTINE W REFLEX MICROSCOPIC
Glucose, UA: NEGATIVE mg/dL
Ketones, ur: NEGATIVE mg/dL
Leukocytes,Ua: NEGATIVE
Nitrite: NEGATIVE
Protein, ur: 100 mg/dL — AB
Specific Gravity, Urine: >=1.03 (ref 1.005–1.030)
pH: 5.5 (ref 5.0–8.0)

## 2023-03-07 LAB — PREGNANCY, URINE: Preg Test, Ur: NEGATIVE

## 2023-03-07 LAB — WET PREP, GENITAL
Clue Cells Wet Prep HPF POC: NONE SEEN
Sperm: NONE SEEN
Trich, Wet Prep: NONE SEEN
WBC, Wet Prep HPF POC: <10 (ref <10)
Yeast Wet Prep HPF POC: NONE SEEN

## 2023-03-07 LAB — HIV ANTIBODY (ROUTINE TESTING W REFLEX): HIV Screen 4th Generation wRfx: NONREACTIVE

## 2023-03-07 MED ORDER — MEGESTROL ACETATE 40 MG PO TABS: 40.0000 mg | ORAL_TABLET | Freq: Every day | ORAL | 0 refills | Status: AC

## 2023-03-07 NOTE — ED Provider Notes (Signed)
 [place] [facility] Provider Note   CSN: [id number] Arrival date & time: 03/07/23  4098     History  Chief Complaint  Patient presents with   Menstrual Problem    Yvette Miller is a 38 y.o. female.  With history of asthma, bronchitis, cholecystectomy presents to the ED for evaluation of abnormal uterine bleeding.  She states she started her menstrual cycle yesterday.  States she is passing large clots.  She is changing her pad every 15 minutes.  She called her gynecologist but states that she has not been able to get an appointment in a few months and her appointments keep getting canceled.  She denies abdominal pain.  She had fevers yesterday but none today.  She denies discharge other than blood.  She is sexually active and uses protection.  No urinary complaints.  Menstrual cycles are typically very regular and typically do not involve heavy bleeding.  States she feels lightheaded when she moves, improved after sitting down.  She was seen on 02/24/2023 at [facility] for lower abdominal pain that was bilateral.  Had a reassuring workup including CT abdomen pelvis.  States her pain has since resolved.  She was treated for a UTI.  HPI     Home Medications Prior to Admission medications   Medication Sig Start Date End Date Taking? Authorizing Provider  megestrol (MEGACE) 40 MG tablet Take 1 tablet (40 mg total) by mouth daily for 7 days. 03/07/23 03/14/23 Yes Lorne Winkels, Edsel Petrin, PA-C  amoxicillin-clavulanate (AUGMENTIN) 875-125 MG tablet Take 1 tablet by mouth every 12 (twelve) hours. 08/26/22   Blue, Soijett A, PA-C  ketorolac (TORADOL) 10 MG tablet Take 1 tablet (10 mg total) by mouth every 6 (six) hours as needed. 06/18/22   Donette Larry, CNM  ondansetron (ZOFRAN-ODT) 4 MG disintegrating tablet Take 1 tablet (4 mg total) by mouth every 8 (eight) hours as needed. 06/08/22   Mardene Sayer, MD      Allergies    Codeine, Doxycycline,  Flagyl [metronidazole], Keflex [cephalexin], and Hydrocodone-acetaminophen    Review of Systems   Review of Systems  Genitourinary:  Positive for vaginal bleeding.  All other systems reviewed and are negative.   Physical Exam Updated Vital Signs BP 125/76   Pulse (!) 106   Temp 97.9 F (36.6 C) (Oral)   Resp 18   LMP 03/06/2022 (Exact Date)   SpO2 98%  Physical Exam Vitals and nursing note reviewed. Exam conducted with a chaperone present Rayfield Citizen, Charity fundraiser).  Constitutional:      General: She is not in acute distress.    Appearance: Normal appearance. She is normal weight. She is not ill-appearing.  HENT:     Head: Normocephalic and atraumatic.  Cardiovascular:     Rate and Rhythm: Normal rate and regular rhythm.  Pulmonary:     Effort: Pulmonary effort is normal. No respiratory distress.  Abdominal:     General: Abdomen is flat.  Genitourinary:    Comments: No external pubic rashes or lesions.  Moderate amount of blood in the vaginal canal.  No active hemorrhage.  No purulent discharge.  No tenderness. Musculoskeletal:        General: Normal range of motion.     Cervical back: Neck supple.  Skin:    General: Skin is warm and dry.  Neurological:     Mental Status: She is alert and oriented to person, place, and time.  Psychiatric:  Mood and Affect: Mood normal.        Behavior: Behavior normal.     ED Results / Procedures / Treatments   Labs (all labs ordered are listed, but only abnormal results are displayed) Labs Reviewed  URINALYSIS, ROUTINE W REFLEX MICROSCOPIC - Abnormal; Notable for the following components:      Result Value   Color, Urine AMBER (*)    APPearance CLOUDY (*)    Hgb urine dipstick LARGE (*)    Bilirubin Urine SMALL (*)    Protein, ur 100 (*)    All other components within normal limits  BASIC METABOLIC PANEL - Abnormal; Notable for the following components:   Sodium 134 (*)    CO2 19 (*)    Glucose, Bld 125 (*)    Calcium 8.6 (*)     All other components within normal limits  CBC WITH DIFFERENTIAL/PLATELET - Abnormal; Notable for the following components:   Hemoglobin 11.8 (*)    HCT 35.5 (*)    All other components within normal limits  URINALYSIS, MICROSCOPIC (REFLEX) - Abnormal; Notable for the following components:   Bacteria, UA RARE (*)    All other components within normal limits  WET PREP, GENITAL  PREGNANCY, URINE  RPR  HIV ANTIBODY (ROUTINE TESTING W REFLEX)  GC/CHLAMYDIA PROBE AMP ([place]) NOT AT [facility]    EKG None  Radiology No results found.  Procedures Procedures    Medications Ordered in ED Medications - No data to display  ED Course/ Medical Decision Making/ A&P                                 Medical Decision Making Amount and/or Complexity of Data Reviewed Labs: ordered.  This patient presents to the ED for concern of vaginal bleeding, this involves an extensive number of treatment options, and is a complaint that carries with it a high risk of complications and morbidity.  The differential diagnosis includes adenomyosis, early pregnancy loss, ectopic pregnancy, uterine fibroids, dysmenorrhea  My initial workup includes labs  Additional history obtained from: Nursing notes from this visit. Previous records within EMR system ED visit on 02/24/2023, lab workup and CT abdomen pelvis reassuring.  No vaginal bleeding at that time.  I ordered, reviewed and interpreted labs which include: STI panel, CBC, BMP, hCG, urinalysis.  No leukocytosis, borderline anemia but stable with a hemoglobin of 11.8.  Mild hyponatremia 134, bicarb decreased to 19 with normal anion gap, hyperglycemia 125.  Wet prep negative.  Urine with large hemoglobin however patient is currently having vaginal bleeding.  No leukocytes or white blood cells.  Rare bacteria.  Urine pregnancy negative.  Afebrile, borderline tachycardic but otherwise hemodynamically stable.  38 year old female presenting to the ED for  evaluation of heavy vaginal bleeding.  Symptoms began yesterday.  She is typically very regular with her menstrual cycles.  She is passing large clots a few times per hour.  She called her gynecologist prior to presenting to the emergency department and was unable to get an appointment.  She denies other complaints.  Denies abdominal pain.  Lab workup was very reassuring.  No significant anemia.  Physical exam reveals moderate amount of blood in the vaginal canal.  No active hemorrhage.  Patient voices frustration with her gynecologist and is considering obtaining a new practice.  Will start prescription for Megace to help stop bleeding.  She was encouraged to follow-up with a gynecologist  as soon as possible.  She was given return precautions.  Stable at discharge.  At this time there does not appear to be any evidence of an acute emergency medical condition and the patient appears stable for discharge with appropriate outpatient follow up. Diagnosis was discussed with patient who verbalizes understanding of care plan and is agreeable to discharge. I have discussed return precautions with patient who verbalizes understanding. Patient encouraged to follow-up with their PCP within 1 week. All questions answered.  Note: Portions of this report may have been transcribed using voice recognition software. Every effort was made to ensure accuracy; however, inadvertent computerized transcription errors may still be present.        Final Clinical Impression(s) / ED Diagnoses Final diagnoses:  Abnormal uterine bleeding (AUB)    Rx / DC Orders ED Discharge Orders          Ordered    megestrol (MEGACE) 40 MG tablet  Daily        03/07/23 1119              Michelle Piper, PA-C 03/07/23 1120    Coral Spikes, DO 03/07/23 1413

## 2023-03-07 NOTE — ED Triage Notes (Addendum)
 Pt reports started period yesterday and 7pm. Passing palm size blood clots. Changed pad every 5 minutes last night. Still passing palm size clots Denies pain [place]en [place] [facility] [place] 02/[place]/23 for sharp pain in ovaries.  Unable to get in OB/gyn Called 911 this morning Dizzy and sweating through out night Fever up to 103 Mon/tuesday

## 2023-03-07 NOTE — Discharge Instructions (Addendum)
 You have been seen today for your complaint of heavy vaginal bleeding. Your lab work was very reassuring.  Your STI panel will be available in 2 to 3 days.  Seek treatment if positive. Your discharge medications include Megace.  This is a medicine used to help with heavy vaginal bleeding.  Take this once daily until your bleeding slows down. Follow up with: A gynecologist as soon as possible Please seek immediate medical care if you develop any of the following symptoms: You faint. You have bleeding that soaks through a sanitary pad every hour. You have pain in the abdomen. You have a fever or chills. You become sweaty or weak. You pass large blood clots from your vagina. At this time there does not appear to be the presence of an emergent medical condition, however there is always the potential for conditions to change. Please read and follow the below instructions.  Do not take your medicine if  develop an itchy rash, swelling in your mouth or lips, or difficulty breathing; call 911 and seek immediate emergency medical attention if this occurs.  You may review your lab tests and imaging results in their entirety on your MyChart account.  Please discuss all results of fully with your primary care provider and other specialist at your follow-up visit.  Note: Portions of this text may have been transcribed using voice recognition software. Every effort was made to ensure accuracy; however, inadvertent computerized transcription errors may still be present.

## 2023-03-08 LAB — RPR: RPR Ser Ql: NONREACTIVE

## 2023-03-08 LAB — GC/CHLAMYDIA PROBE AMP (~~LOC~~) NOT AT ARMC
Chlamydia: NEGATIVE
Comment: NEGATIVE
Comment: NORMAL
Neisseria Gonorrhea: NEGATIVE

## 2023-03-14 ENCOUNTER — Ambulatory Visit (INDEPENDENT_AMBULATORY_CARE_PROVIDER_SITE_OTHER): Payer: Medicaid Other | Admitting: Family Medicine

## 2023-03-14 VITALS — BP 135/90 | HR 88 | Ht 63.0 in | Wt 259.0 lb

## 2023-03-14 DIAGNOSIS — N939 Abnormal uterine and vaginal bleeding, unspecified: Secondary | ICD-10-CM

## 2023-03-14 NOTE — Progress Notes (Unsigned)
    SUBJECTIVE:   CHIEF COMPLAINT / HPI:   AUB Patient presenting for evaluation for abnormal uterine bleeding.  Last period was February 18 where she was passing clots the size of her hand through the 19th.  They stopped on the 20th and she has not been bleeding since.  Her previous.  In January was January 24 and was a completely normal disease.  She is sexually active and is not on birth control.  She is interested in getting pregnant in the next few months if possible. PERTINENT  PMH / PSH: ***  OBJECTIVE:   BP (!) 135/90   Pulse 88   Ht 5\' 3"  (1.6 m)   Wt 259 lb (117.5 kg)   LMP 03/05/2022   BMI 45.88 kg/m   ***  ASSESSMENT/PLAN:   No problem-specific Assessment & Plan notes found for this encounter.     Celedonio Savage, MD [facility]

## 2023-03-16 DIAGNOSIS — N939 Abnormal uterine and vaginal bleeding, unspecified: Secondary | ICD-10-CM | POA: Insufficient documentation

## 2023-03-16 NOTE — Assessment & Plan Note (Signed)
 Patient with extremely heavy bleeding during last menses passing large clots.  It has now resolved and she is not having any bleeding at this time.  Is sexually active and not on birth control.  Does not wish to be on birth control at this time.  Discussed that this may be an anomaly in her normal menses and recommended continued monitoring.  Discussed treatment with TXA versus Megace if needed but do not feel like it is indicated at this time.  Discussed tricked return precautions.  No further questions or concerns.

## 2023-05-02 ENCOUNTER — Ambulatory Visit: Admitting: Obstetrics & Gynecology

## 2023-05-02 ENCOUNTER — Other Ambulatory Visit (HOSPITAL_COMMUNITY)
Admission: RE | Admit: 2023-05-02 | Discharge: 2023-05-02 | Disposition: A | Discharge status: Home / Self Care | Source: Ambulatory Visit | Attending: Obstetrics & Gynecology | Admitting: Obstetrics & Gynecology

## 2023-05-02 VITALS — BP 123/82 | HR 80 | Ht 62.5 in | Wt 260.0 lb

## 2023-05-02 DIAGNOSIS — Z01419 Encounter for gynecological examination (general) (routine) without abnormal findings: Secondary | ICD-10-CM | POA: Insufficient documentation

## 2023-05-02 NOTE — Progress Notes (Signed)
 Subjective:     Yvette Miller is a 38 y.o. female here for a routine exam.  Current complaints: Pt relatively new to our practice. She came in Feb 2025 due to heavy menses. Referred by the ED. This is her first annual with our ofc. Her last PAP was 3 years prev. She denies any current problems. She is interested in getting pregnant. .     Gynecologic History Patient's last menstrual period was 05/01/2023 (exact date). Contraception: none Last Pap: 3 years prev. Results were: normal per pt. Not seen in Care Everywhere.  Last mammogram: n/a  Obstetric History OB History  Gravida Para Term Preterm AB Living  7 3 1 2 3 3   SAB IAB Ectopic Multiple Live Births  2 1   3     # Outcome Date GA Lbr Len/2nd Weight Sex Type Anes PTL Lv  7 Gravida           6 SAB 2024             Birth Comments: SAB in March of 2024 per patient report  5 Preterm 2014    F Vag-Spont   LIV     Birth Comments: per patient report IOL at 34 weeks for pre-eclampsia  4 Preterm 2011    M Vag-Spont   LIV     Birth Comments: 35 week IOL for pre-eclampsia per patient report  3 Term 2009    M Vag-Spont   LIV  2 IAB 2004          1 SAB 2002             The following portions of the patient's history were reviewed and updated as appropriate: allergies, current medications, past family history, past medical history, past social history, past surgical history, and problem list.  Review of Systems Pertinent items are noted in HPI.    Objective:  BP 123/82 (BP Location: Left Arm, Patient Position: Sitting, Cuff Size: Large)   Pulse 80   Ht 5' 2.5" (1.588 m)   Wt 260 lb (117.9 kg)   LMP 05/01/2023 (Exact Date)   BMI 46.80 kg/m  General Appearance:    Alert, cooperative, no distress, appears stated age  Head:    Normocephalic, without obvious abnormality, atraumatic  Eyes:    conjunctiva/corneas clear, EOM's intact, both eyes  Ears:    Normal external ear canals, both ears  Nose:   Nares normal, septum midline, mucosa  normal, no drainage    or sinus tenderness  Throat:   Lips, mucosa, and tongue normal; teeth and gums normal  Neck:   Supple, symmetrical, trachea midline, no adenopathy;    thyroid:  no enlargement/tenderness/nodules  Back:     Symmetric, no curvature, ROM normal, no CVA tenderness  Lungs:     respirations unlabored  Chest Wall:    No tenderness or deformity   Heart:    Regular rate and rhythm  Breast Exam:    No tenderness, masses, or nipple abnormality  Abdomen:     Soft, non-tender, bowel sounds active all four quadrants,    no masses, no organomegaly  Genitalia:    Normal female without lesion, discharge or tenderness     Extremities:   Extremities normal, atraumatic, no cyanosis or edema  Pulses:   2+ and symmetric all extremities  Skin:   Skin color, texture, turgor normal, no rashes or lesions      Assessment:    Healthy female exam.    Plan:  Yvette Miller was seen today for annual exam.  Diagnoses and all orders for this visit:  Well female exam with routine gynecological exam -     Cytology - PAP( [place])   Rec PNV 1 po daily F/u in 1 year or sooner prn   Kennedy Brines L. Harraway-Smith, M.D., FACOG

## 2023-05-11 LAB — CYTOLOGY - PAP
Comment: NEGATIVE
Diagnosis: NEGATIVE
High risk HPV: NEGATIVE

## 2023-05-23 ENCOUNTER — Encounter: Payer: Self-pay | Admitting: Obstetrics & Gynecology

## 2023-06-17 ENCOUNTER — Encounter (HOSPITAL_BASED_OUTPATIENT_CLINIC_OR_DEPARTMENT_OTHER): Payer: Self-pay | Admitting: Emergency Medicine

## 2023-06-17 ENCOUNTER — Emergency Department (HOSPITAL_BASED_OUTPATIENT_CLINIC_OR_DEPARTMENT_OTHER)

## 2023-06-17 ENCOUNTER — Emergency Department (HOSPITAL_BASED_OUTPATIENT_CLINIC_OR_DEPARTMENT_OTHER)
Admission: EM | Admit: 2023-06-17 | Discharge: 2023-06-17 | Disposition: A | Discharge status: Home / Self Care | Attending: Emergency Medicine | Admitting: Emergency Medicine

## 2023-06-17 DIAGNOSIS — M25531 Pain in right wrist: Secondary | ICD-10-CM | POA: Insufficient documentation

## 2023-06-17 DIAGNOSIS — M25551 Pain in right hip: Secondary | ICD-10-CM

## 2023-06-17 LAB — PREGNANCY, URINE: Preg Test, Ur: NEGATIVE

## 2023-06-17 MED ORDER — CYCLOBENZAPRINE HCL 10 MG PO TABS
10.0000 mg | ORAL_TABLET | Freq: Two times a day (BID) | ORAL | 0 refills | Status: AC | PRN
Start: 2023-06-17 — End: ?

## 2023-06-17 MED ORDER — IBUPROFEN 800 MG PO TABS: 800.0000 mg | ORAL_TABLET | Freq: Once | ORAL | Status: DC

## 2023-06-17 NOTE — ED Provider Notes (Signed)
 [facility] [facility] Provider Note   CSN: [id number] Arrival date & time: 06/17/23  1319     History  Chief Complaint  Patient presents with   Leg Pain   HPI Yvette Miller is a 38 y.o. female presenting for right hip pain.  Started this past Thursday.  She reports that she did fall from standing about a week before the pain started and did land on the right side of her body.  Also had right wrist pain at that time.  She states that the pain in her hip is mostly about the lateral aspect of the hip and does radiate to the right buttock and at times the pain does shoot down her right leg.  She denies any abdominal pain or back pain.  Denies urinary or bowel changes,.  Denies numbness and weakness.  She is still able to ambulate and bear weight but pain is made worse by ambulation.  Denies fever.  Denies saddle anesthesia, urinary or bowel incontinence.   Leg Pain      Home Medications Prior to Admission medications   Medication Sig Start Date End Date Taking? Authorizing Provider  albuterol  (VENTOLIN  HFA) 108 (90 Base) MCG/ACT inhaler Inhale 2 puffs into the lungs every 6 (six) hours as needed for wheezing or shortness of breath.    [provider]  amoxicillin -clavulanate (AUGMENTIN ) 875-125 MG tablet Take 1 tablet by mouth every 12 (twelve) hours. Patient not taking: Reported on 03/14/2023 08/26/22   Blue, Soijett A, PA-C  cyclobenzaprine (FLEXERIL) 10 MG tablet Take 10 mg by mouth 3 (three) times daily as needed for muscle spasms.    [provider]  ketorolac  (TORADOL ) 10 MG tablet Take 1 tablet (10 mg total) by mouth every 6 (six) hours as needed. Patient not taking: Reported on 05/02/2023 06/18/22   Kit Peri, CNM  ondansetron  (ZOFRAN -ODT) 4 MG disintegrating tablet Take 1 tablet (4 mg total) by mouth every 8 (eight) hours as needed. Patient not taking: Reported on 03/14/2023 06/08/22   Arnie Bibber, MD  topiramate  (TOPAMAX) 25 MG tablet Take 25 mg by mouth 2 (two) times daily.    [provider]  WEGOVY 0.25 MG/0.5ML SOAJ 0.25 MG SUBCUTANEOUSLY EVERY WEEK ADMINISTER WEEKS 1 THROUGH 4 OF THERAPY 01/19/23   [provider]      Allergies    Codeine, Doxycycline, Flagyl  [metronidazole ], Keflex  [cephalexin ], and Hydrocodone-acetaminophen     Review of Systems   See HPI  Physical Exam Updated Vital Signs BP 127/82 (BP Location: Left Arm)   Pulse 92   Temp 97.8 F (36.6 C)   Resp 20   Ht 5\' 2"  (1.575 m)   Wt 115.2 kg   SpO2 95%   BMI 46.46 kg/m  Physical Exam Constitutional:      Appearance: Normal appearance.  HENT:     Head: Normocephalic.     Nose: Nose normal.  Eyes:     Conjunctiva/sclera: Conjunctivae normal.  Pulmonary:     Effort: Pulmonary effort is normal.  Abdominal:     General: There is no distension.     Palpations: Abdomen is soft.     Tenderness: There is no abdominal tenderness.  Musculoskeletal:     Lumbar back: Normal. No tenderness. Normal range of motion.     Right hip: Tenderness present. No deformity, lacerations, bony tenderness or crepitus. Normal range of motion. Normal strength.     Left hip: Normal.     Right foot:  Normal pulse.     Left foot: Normal pulse.  Neurological:     Mental Status: She is alert.  Psychiatric:        Mood and Affect: Mood normal.     ED Results / Procedures / Treatments   Labs (all labs ordered are listed, but only abnormal results are displayed) Labs Reviewed  PREGNANCY, URINE    EKG None  Radiology DG Hip Unilat W or Wo Pelvis 2-3 Views Right Result Date: 06/17/2023 CLINICAL DATA:  Right hip pain.  Fall last Tuesday. EXAM: DG HIP (WITH OR WITHOUT PELVIS) 2-3V RIGHT COMPARISON:  None Available. FINDINGS: No acute fracture of the pelvis or right hip. No hip dislocation. Pubic rami are intact. Pubic symphysis and sacroiliac joints are congruent. No evidence of erosion or avascular necrosis. Unremarkable  soft tissues. IMPRESSION: Negative radiographs of the pelvis and right hip. Electronically Signed   By: Chadwick Colonel M.D.   On: 06/17/2023 16:22    Procedures Procedures    Medications Ordered in ED Medications  ibuprofen  (ADVIL ) tablet 800 mg (has no administration in time range)    ED Course/ Medical Decision Making/ A&P                                 Medical Decision Making Amount and/or Complexity of Data Reviewed Labs: ordered. Radiology: ordered.   38 year old well-appearing female presenting for right hip pain.  Exam notable for tenderness about the lateral aspect of the right hip primarily but range of motion intact and she is ambulatory here.  No signs of infection in the hip.  She did have some tenderness overlying the sciatic notch and with the shooting sharp pain, likely sciatica.  Advised NSAIDs and sent flexeril to her pharmacy also advised her to follow-up with her PCP.  X-ray was negative for acute process.  Discharge.        Final Clinical Impression(s) / ED Diagnoses Final diagnoses:  Right hip pain    Rx / DC Orders ED Discharge Orders     None         Janalee Mcmurray, PA-C 06/17/23 1639    Albertus Hughs, DO 06/18/23 [phone]

## 2023-06-17 NOTE — ED Notes (Signed)
 Pt states she is unable to obtain urine at this time

## 2023-06-17 NOTE — Discharge Instructions (Addendum)
 Evaluations concerning for sciatica.  Unfortunately this is likely a chronic issue.  Please follow-up your PCP.  I sent Flexeril to your pharmacy but do also recommend Advil  and Tylenol .  You can also apply ice 3-4 times a day.  If you have new trauma to the hip, develop numbness in your saddle area, urinary or bowel incontinence or fever or any other concerning symptom please return to the ED for further evaluation.

## 2023-06-17 NOTE — ED Triage Notes (Signed)
 Pt reports pain that will shoot from RT hip down to foot and back up since Thurs; she fell about 1 wk ago and was seen at PCP for that (wrist pain)

## 2023-09-26 ENCOUNTER — Ambulatory Visit: Admitting: Family Medicine

## 2023-09-26 VITALS — BP 130/99 | HR 104 | Ht 62.5 in | Wt 247.1 lb

## 2023-09-26 DIAGNOSIS — Z3169 Encounter for other general counseling and advice on procreation: Secondary | ICD-10-CM

## 2023-09-26 NOTE — Progress Notes (Signed)
   Subjective:    Patient ID: Yvette Miller, female    DOB: 12-Feb-1985, 38 y.o.   MRN: [id number]  HPI  Patient seen for concerns of fertility. She had a miscarriage last year. Has a new partner and is wanting to get pregnant. She does have children from a previous relationship. He does not have any children. She is having regular menses. They have been trying to get pregnant for the last 2 months. She is charting her cycles.     She is no longer on Wegovy.   Review of Systems     Objective:   Physical Exam Vitals reviewed.  Constitutional:      Appearance: Normal appearance.  Cardiovascular:     Rate and Rhythm: Normal rate and regular rhythm.  Pulmonary:     Effort: Pulmonary effort is normal.     Breath sounds: Normal breath sounds.  Skin:    Capillary Refill: Capillary refill takes less than 2 seconds.  Neurological:     General: No focal deficit present.     Mental Status: She is alert.  Psychiatric:        Mood and Affect: Mood normal.        Behavior: Behavior normal.        Thought Content: Thought content normal.        Judgment: Judgment normal.         Assessment & Plan:  1. Encounter for preconception consultation (Primary) Check HgA1c and TSH. Recommended patient and her partner continue trying to get pregnant. If no pregnancy in another 4 months, then will do fertility work up. Will need sperm analysis. - Hemoglobin A1c - TSH Rfx on Abnormal to Free T4

## 2023-09-27 LAB — HEMOGLOBIN A1C
Est. average glucose Bld gHb Est-mCnc: 105 mg/dL
Hgb A1c MFr Bld: 5.3 % (ref 4.8–5.6)

## 2023-09-27 LAB — TSH RFX ON ABNORMAL TO FREE T4: TSH: 0.961 u[IU]/mL (ref 0.450–4.500)

## 2023-10-01 ENCOUNTER — Ambulatory Visit: Payer: Self-pay | Admitting: Family Medicine

## 2024-01-21 ENCOUNTER — Other Ambulatory Visit (HOSPITAL_COMMUNITY)
Admission: RE | Admit: 2024-01-21 | Discharge: 2024-01-21 | Disposition: A | Discharge status: Home / Self Care | Source: Ambulatory Visit | Attending: Advanced Practice Midwife | Admitting: Advanced Practice Midwife

## 2024-01-21 ENCOUNTER — Ambulatory Visit

## 2024-01-21 VITALS — BP 121/82 | HR 80 | Ht 62.0 in | Wt 252.0 lb

## 2024-01-21 DIAGNOSIS — Z113 Encounter for screening for infections with a predominantly sexual mode of transmission: Secondary | ICD-10-CM | POA: Insufficient documentation

## 2024-01-21 NOTE — Progress Notes (Signed)
 SUBJECTIVE:  39 y.o. female who desires a STI screen. Denies abnormal vaginal discharge, bleeding or significant pelvic pain. No UTI symptoms. Denies history of known exposure to STD.  Patient's last menstrual period was 12/28/2023 (exact date).  OBJECTIVE:  She appears well.   ASSESSMENT:  STI Screen   PLAN:  Pt offered STI blood screening-requested GC, chlamydia, and trichomonas probe sent to lab.  Treatment: To be determined once lab results are received.  Pt follow up as needed.   [facility] [employer]

## 2024-01-22 LAB — HIV ANTIBODY (ROUTINE TESTING W REFLEX): HIV Screen 4th Generation wRfx: NONREACTIVE

## 2024-01-23 LAB — CERVICOVAGINAL ANCILLARY ONLY
Bacterial Vaginitis (gardnerella): POSITIVE — AB
Candida Glabrata: NEGATIVE
Candida Vaginitis: POSITIVE — AB
Chlamydia: NEGATIVE
Comment: NEGATIVE
Comment: NEGATIVE
Comment: NEGATIVE
Comment: NEGATIVE
Comment: NEGATIVE
Comment: NORMAL
Neisseria Gonorrhea: NEGATIVE
Trichomonas: NEGATIVE

## 2024-01-24 ENCOUNTER — Other Ambulatory Visit: Payer: Self-pay

## 2024-01-24 ENCOUNTER — Telehealth: Payer: Self-pay

## 2024-01-24 DIAGNOSIS — B379 Candidiasis, unspecified: Secondary | ICD-10-CM

## 2024-01-24 DIAGNOSIS — B9689 Other specified bacterial agents as the cause of diseases classified elsewhere: Secondary | ICD-10-CM

## 2024-01-24 MED ORDER — FLUCONAZOLE 150 MG PO TABS: 150.0000 mg | ORAL_TABLET | Freq: Once | ORAL | 0 refills | Status: AC

## 2024-01-24 MED ORDER — CLINDAMYCIN PHOSPHATE 2 % VA CREA: 1.0000 | TOPICAL_CREAM | Freq: Every day | VAGINAL | Status: AC

## 2024-01-24 NOTE — Telephone Encounter (Signed)
 Patient called regarding swab results.  +BV and yeast.  Rx sent to pharmacy.  Patient aware.  Erminio DELENA Rumps, RN

## 2024-01-30 ENCOUNTER — Ambulatory Visit: Payer: Self-pay | Admitting: Advanced Practice Midwife

## 2024-01-30 DIAGNOSIS — B379 Candidiasis, unspecified: Secondary | ICD-10-CM

## 2024-01-30 DIAGNOSIS — B9689 Other specified bacterial agents as the cause of diseases classified elsewhere: Secondary | ICD-10-CM

## 2024-01-31 MED ORDER — FLUCONAZOLE 150 MG PO TABS: ORAL_TABLET | ORAL | 0 refills | Status: AC

## 2024-01-31 MED ORDER — CLINDAMYCIN HCL 300 MG PO CAPS: 300.0000 mg | ORAL_CAPSULE | Freq: Two times a day (BID) | ORAL | 0 refills | Status: AC

## 2024-01-31 NOTE — Telephone Encounter (Signed)
 Patient sent Mychart message requesting medication for BV and yeast. Clindamycin  300 mg take 1 tablet PO x 7 days and Diflucan  150 mg 1 tablet PO was sent to her pharmacy.  Erina Hamme l Zanyah Lentsch, CMA
